# Patient Record
Sex: Male | Born: 1950 | Race: White | Hispanic: No | Marital: Married | Smoking: Never smoker
Health system: Southern US, Community
[De-identification: ages and names within clinical notes are randomized; demographics above are authoritative.]

## PROBLEM LIST (undated history)

## (undated) DIAGNOSIS — G589 Mononeuropathy, unspecified: Secondary | ICD-10-CM

## (undated) DIAGNOSIS — B019 Varicella without complication: Secondary | ICD-10-CM

## (undated) DIAGNOSIS — N189 Chronic kidney disease, unspecified: Secondary | ICD-10-CM

## (undated) DIAGNOSIS — M1990 Unspecified osteoarthritis, unspecified site: Secondary | ICD-10-CM

## (undated) DIAGNOSIS — T7840XA Allergy, unspecified, initial encounter: Secondary | ICD-10-CM

## (undated) DIAGNOSIS — Z9109 Other allergy status, other than to drugs and biological substances: Secondary | ICD-10-CM

## (undated) DIAGNOSIS — H269 Unspecified cataract: Secondary | ICD-10-CM

## (undated) DIAGNOSIS — C493 Malignant neoplasm of connective and soft tissue of thorax: Secondary | ICD-10-CM

## (undated) DIAGNOSIS — F419 Anxiety disorder, unspecified: Secondary | ICD-10-CM

## (undated) DIAGNOSIS — S149XXA Injury of unspecified nerves of neck, initial encounter: Secondary | ICD-10-CM

## (undated) DIAGNOSIS — N400 Enlarged prostate without lower urinary tract symptoms: Secondary | ICD-10-CM

## (undated) HISTORY — PX: APPENDECTOMY: SHX54

## (undated) HISTORY — PX: VASECTOMY: SHX75

## (undated) HISTORY — DX: Malignant neoplasm of connective and soft tissue of thorax: C49.3

## (undated) HISTORY — PX: COLONOSCOPY: SHX174

## (undated) HISTORY — PX: JOINT REPLACEMENT: SHX530

## (undated) HISTORY — PX: KNEE ARTHROSCOPY: SUR90

## (undated) HISTORY — DX: Allergy, unspecified, initial encounter: T78.40XA

## (undated) HISTORY — DX: Varicella without complication: B01.9

## (undated) HISTORY — PX: TONSILLECTOMY: SUR1361

---

## 1978-09-24 HISTORY — PX: CHOLECYSTECTOMY: SHX55

## 2003-09-25 HISTORY — PX: WRIST SURGERY: SHX841

## 2008-09-24 HISTORY — PX: HERNIA REPAIR: SHX51

## 2009-08-21 ENCOUNTER — Emergency Department (HOSPITAL_COMMUNITY): Admission: EM | Admit: 2009-08-21 | Discharge: 2009-08-21 | Payer: Self-pay | Admitting: Emergency Medicine

## 2009-09-13 ENCOUNTER — Encounter: Admission: RE | Admit: 2009-09-13 | Discharge: 2009-09-13 | Payer: Self-pay | Admitting: General Surgery

## 2009-12-29 IMAGING — CR DG CHEST 2V
2 series · 2 of 2 positions shown · non-contrast
Comparison: 08/21/2009

CLINICAL DATA: Preop.  Chest tightness and congestion.

[view not recorded (1 of 2)]
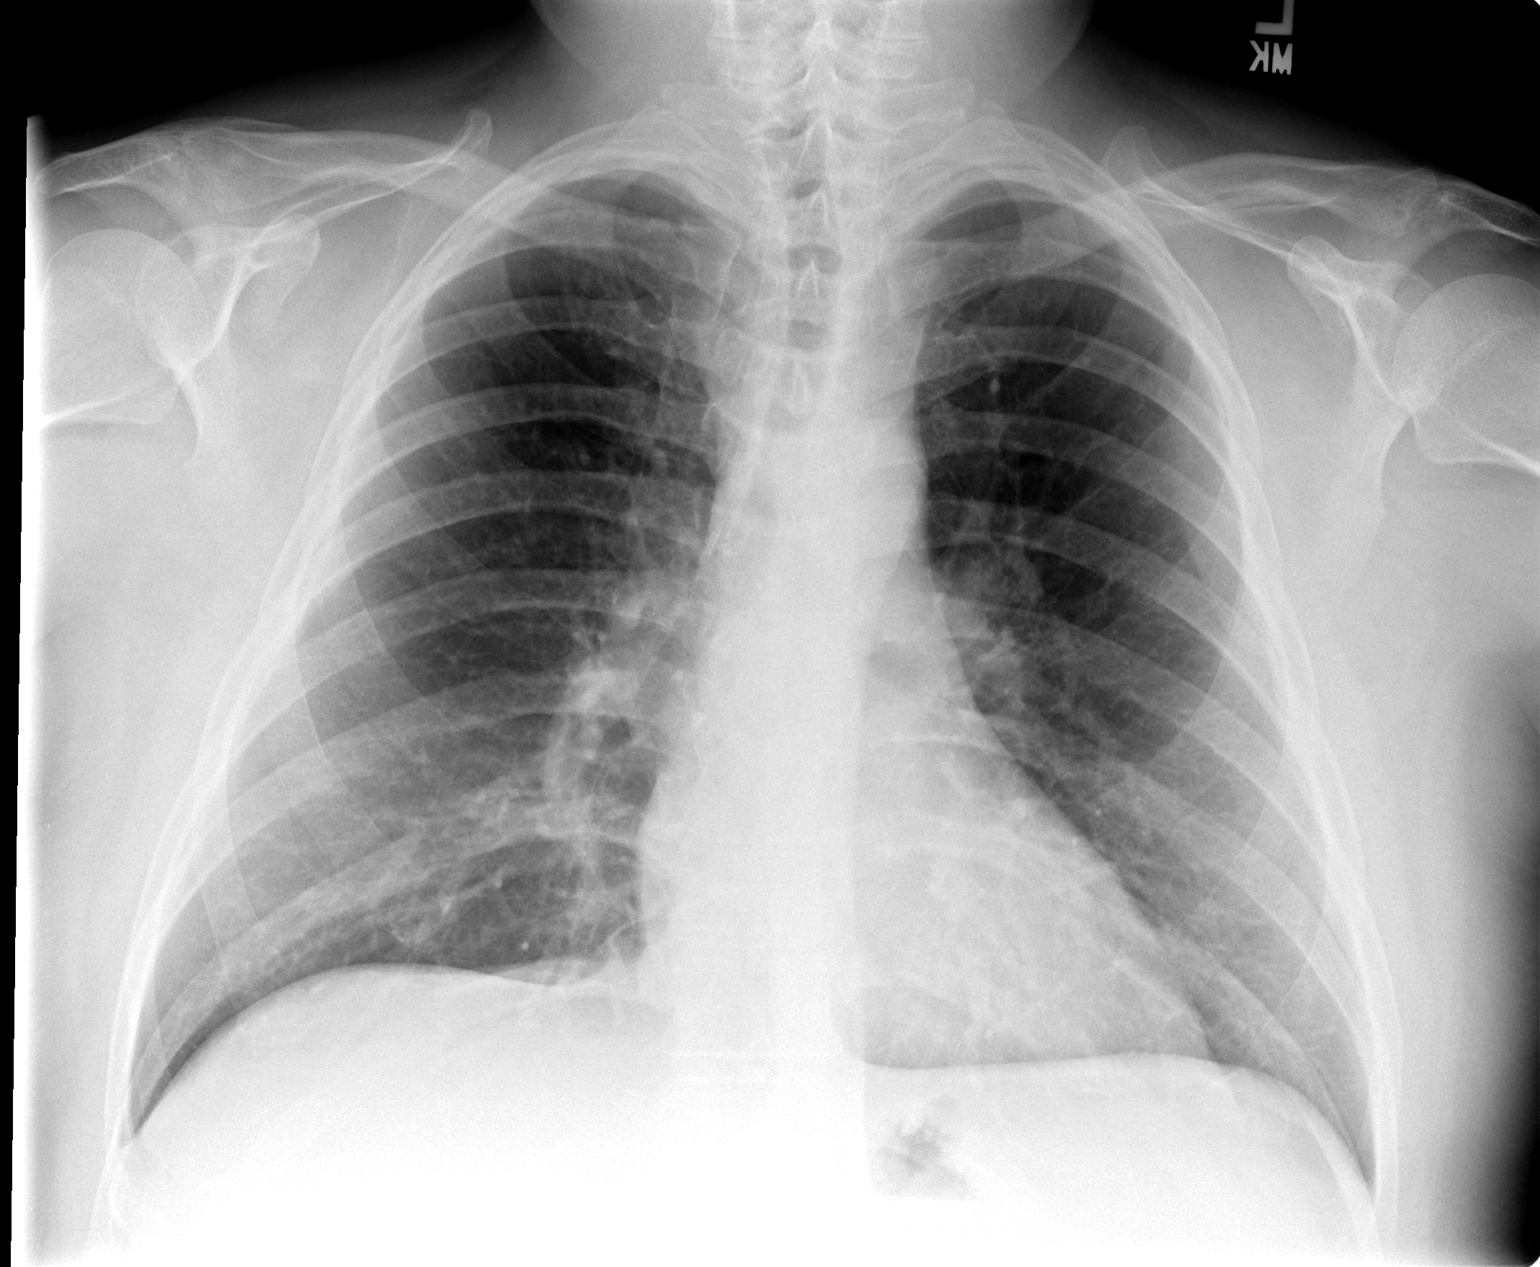

[view not recorded (2 of 2)]
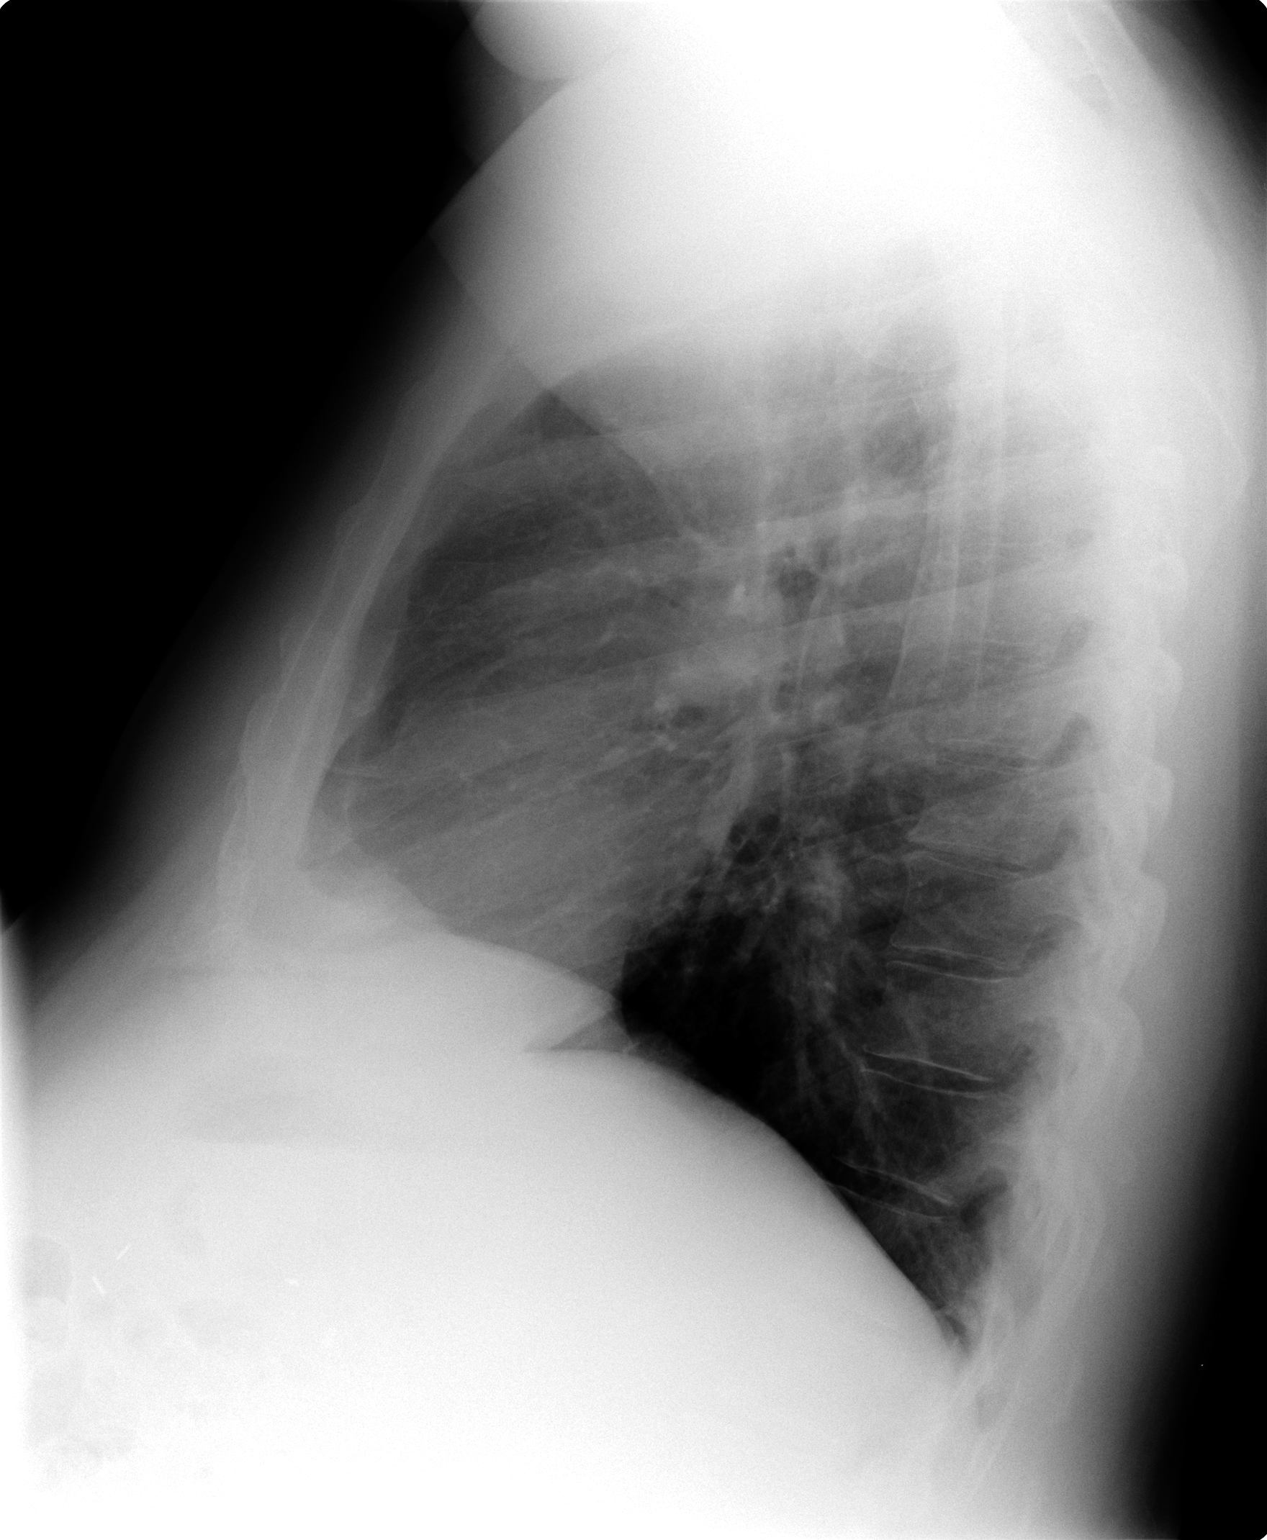

[2 of 2 positions shown; findings below may reference images not displayed]

FINDINGS: Trachea is midline.  Heart size normal.  Lungs are clear.
No pleural fluid.
IMPRESSION: No acute findings.

## 2011-08-09 ENCOUNTER — Encounter (HOSPITAL_COMMUNITY): Payer: Self-pay | Admitting: Pharmacy Technician

## 2011-08-10 ENCOUNTER — Encounter (HOSPITAL_COMMUNITY): Payer: Self-pay

## 2011-08-10 ENCOUNTER — Other Ambulatory Visit: Payer: Self-pay

## 2011-08-10 ENCOUNTER — Ambulatory Visit (HOSPITAL_COMMUNITY)
Admission: RE | Admit: 2011-08-10 | Discharge: 2011-08-10 | Disposition: A | Discharge status: Home / Self Care | Payer: BC Managed Care – PPO | Source: Ambulatory Visit | Attending: Orthopedic Surgery | Admitting: Orthopedic Surgery

## 2011-08-10 ENCOUNTER — Encounter (HOSPITAL_COMMUNITY)
Admission: RE | Admit: 2011-08-10 | Discharge: 2011-08-10 | Disposition: A | Discharge status: Home / Self Care | Payer: BC Managed Care – PPO | Source: Ambulatory Visit | Attending: Orthopedic Surgery | Admitting: Orthopedic Surgery

## 2011-08-10 DIAGNOSIS — Z01810 Encounter for preprocedural cardiovascular examination: Secondary | ICD-10-CM | POA: Insufficient documentation

## 2011-08-10 DIAGNOSIS — Z01812 Encounter for preprocedural laboratory examination: Secondary | ICD-10-CM | POA: Insufficient documentation

## 2011-08-10 DIAGNOSIS — Z01818 Encounter for other preprocedural examination: Secondary | ICD-10-CM | POA: Insufficient documentation

## 2011-08-10 DIAGNOSIS — E119 Type 2 diabetes mellitus without complications: Secondary | ICD-10-CM | POA: Insufficient documentation

## 2011-08-10 DIAGNOSIS — Q619 Cystic kidney disease, unspecified: Secondary | ICD-10-CM | POA: Insufficient documentation

## 2011-08-10 HISTORY — DX: Unspecified osteoarthritis, unspecified site: M19.90

## 2011-08-10 HISTORY — DX: Chronic kidney disease, unspecified: N18.9

## 2011-08-10 LAB — BASIC METABOLIC PANEL
CO2: 27 mEq/L (ref 19–32)
Calcium: 9.4 mg/dL (ref 8.4–10.5)
Creatinine, Ser: 1.16 mg/dL (ref 0.50–1.35)

## 2011-08-10 LAB — URINALYSIS, ROUTINE W REFLEX MICROSCOPIC
Bilirubin Urine: NEGATIVE
Glucose, UA: NEGATIVE mg/dL
Hgb urine dipstick: NEGATIVE
Specific Gravity, Urine: 1.007 (ref 1.005–1.030)
pH: 7 (ref 5.0–8.0)

## 2011-08-10 LAB — DIFFERENTIAL
Basophils Absolute: 0 10*3/uL (ref 0.0–0.1)
Basophils Relative: 0 % (ref 0–1)
Eosinophils Absolute: 0.2 10*3/uL (ref 0.0–0.7)
Eosinophils Relative: 3 % (ref 0–5)
Lymphocytes Relative: 24 % (ref 12–46)

## 2011-08-10 LAB — CBC
MCV: 81.8 fL (ref 78.0–100.0)
Platelets: 211 10*3/uL (ref 150–400)
RDW: 13.8 % (ref 11.5–15.5)
WBC: 7.9 10*3/uL (ref 4.0–10.5)

## 2011-08-10 LAB — ABO/RH: ABO/RH(D): O POS

## 2011-08-10 LAB — APTT: aPTT: 29 seconds (ref 24–37)

## 2011-08-10 LAB — SURGICAL PCR SCREEN: Staphylococcus aureus: NEGATIVE

## 2011-08-10 LAB — PROTIME-INR: Prothrombin Time: 13.1 seconds (ref 11.6–15.2)

## 2011-08-10 NOTE — Pre-Procedure Instructions (Signed)
20 Melvin Montoya  08/10/2011   Your procedure is scheduled on: 08-20-2011  Report to [facility] at 5:30 AM.  Call this number if you have problems the morning of surgery: [phone]   Remember:   Do not eat food:After Midnight.  Do not drink clear liquids: 4 Hours before arrival.  May have soda,tea,water,black coffee,apple juice and grape juice until 1:30 AM  Take these medicines the morning of surgery with A SIP OF WATER: Avodart, Hydrocodone,Nasonex,Flomax   Do not wear jewelry, make-up or nail polish.  Do not wear lotions, powders, or perfumes. You may wear deodorant.  Do not shave 48 hours prior to surgery.  Do not bring valuables to the hospital.  Contacts, dentures or bridgework may not be worn into surgery.  Leave suitcase in the car. After surgery it may be brought to your room.  For patients admitted to the hospital, checkout time is 11:00 AM the day of discharge.   Patients discharged the day of surgery will not be allowed to drive home.  Name and phone number of your driver:  Special Instructions: Incentive Spirometry - Practice and bring it with you on the day of surgery. and CHG Shower Use Special Wash: 1/2 bottle night before surgery and 1/2 bottle morning of surgery.   Please read over the following fact sheets that you were given: Blood Transfusion Information, MRSA Information and Surgical Site Infection Prevention    

## 2011-08-10 NOTE — Pre-Procedure Instructions (Signed)
20 Melvin Montoya  08/10/2011   Your procedure is scheduled on: 08-20-2011  Report to [facility] at 5:30 AM.  Call this number if you have problems the morning of surgery: [phone]   Remember:   Do not eat food:After Midnight.  Do not drink clear liquids: 4 Hours before arrival.  May have soda,tea,water,black coffee,apple juice and grape juice until 1:30 AM  Take these medicines the morning of surgery with A SIP OF WATER: Avodart, Hydrocodone,Nasonex,Flomax   Do not wear jewelry, make-up or nail polish.  Do not wear lotions, powders, or perfumes. You may wear deodorant.  Do not shave 48 hours prior to surgery.  Do not bring valuables to the hospital.  Contacts, dentures or bridgework may not be worn into surgery.  Leave suitcase in the car. After surgery it may be brought to your room.  For patients admitted to the hospital, checkout time is 11:00 AM the day of discharge.   Patients discharged the day of surgery will not be allowed to drive home.  Name and phone number of your driver:  Special Instructions: Incentive Spirometry - Practice and bring it with you on the day of surgery. and CHG Shower Use Special Wash: 1/2 bottle night before surgery and 1/2 bottle morning of surgery.   Please read over the following fact sheets that you were given: Blood Transfusion Information, MRSA Information and Surgical Site Infection Prevention    

## 2011-08-10 NOTE — Progress Notes (Signed)
SPOKE WITH CAROL AT [facility] OFFICE RE WHETHER PT NEEDS TO STOP ASPIRIN.  CAROL WILL CHECK WITH DR Turner Daniels AND CALL PATIENT AT HOME .

## 2011-08-10 NOTE — Pre-Procedure Instructions (Signed)
20 Melvin Montoya  08/10/2011   Your procedure is scheduled on: 08-20-2011  Report to [facility] at 5:30 AM.  Call this number if you have problems the morning of surgery: [phone]   Remember:   Do not eat food:After Midnight.  Do not drink clear liquids: 4 Hours before arrival.  May have soda,tea,water,black coffee,apple juice and grape juice until 1:30 AM  Take these medicines the morning of surgery with A SIP OF WATER: Avodart, Hydrocodone,Nasonex,Flomax   Do not wear jewelry, make-up or nail polish.  Do not wear lotions, powders, or perfumes. You may wear deodorant.  Do not shave 48 hours prior to surgery.  Do not bring valuables to the hospital.  Contacts, dentures or bridgework may not be worn into surgery.  Leave suitcase in the car. After surgery it may be brought to your room.  For patients admitted to the hospital, checkout time is 11:00 AM the day of discharge.   Patients discharged the day of surgery will not be allowed to drive home.  Name and phone number of your driver:  Special Instructions: Incentive Spirometry - Practice and bring it with you on the day of surgery. and CHG Shower Use Special Wash: 1/2 bottle night before surgery and 1/2 bottle morning of surgery.   Please read over the following fact sheets that you were given: Blood Transfusion Information, MRSA Information and Surgical Site Infection Prevention

## 2011-08-11 ENCOUNTER — Encounter (HOSPITAL_COMMUNITY): Payer: Self-pay | Admitting: *Deleted

## 2011-08-19 MED ORDER — CEFAZOLIN SODIUM 1-5 GM-% IV SOLN
1.0000 g | INTRAVENOUS | Status: DC
  Filled 2011-08-19: qty 50

## 2011-08-19 MED ORDER — DEXTROSE-NACL 5-0.45 % IV SOLN: INTRAVENOUS | Status: DC

## 2011-08-19 NOTE — H&P (Signed)
  CC: Severe L>R Knee Pain DJD  HISTORY OF PRESENT ILLNESS:  Mr. Gann returns after a 3-year absence for discussion of the predicament he has with both knees.  Right now the left hurts worse than the right.  When I saw him back in 2009 he had months osteoarthritis.  He had been treated with anti-inflammatory medicines, physical therapy, cortisone injections, and most recently Supartz injections in September 2009 that initially gave him good pain relief but his pain is now once again increasing.  It wakes him at night.  It interferes with doing daily chores and he has failed other conservative measures.  He is interested in having a knee replacement, left than right.  No fevers.  No chills.  No intervening trauma.  PMH\ROS:  He noticed the pain first in 2009, steadily getting worse.  He has had back pain in the past.  No allergies.  He is a diabetic on oral medications and diet.  He does not have an elevated cholesterol.  He wears contact lenses, has a history of a cough, blood in his stool.  Medications include Vytorin, Actos, Avodart, tamsulosin, Lisinopril.  He has never taken blood thinners.  Previous hospitalizations:  None.  Surgeries:  Gallbladder removal in 1981, wrist surgery in 2006, umbilical hernia 2011, and the arthroscopic knee surgery in 2009.  Family History:  Positive for diabetes and heart disease.    He does not smoke, does not drink.  He is married and is here with his wife.  He has been out of work now for about a week or so and is retired.  OBJECTIVE:  Both knees have 5 degree flexion contractures and 5 degree varus deformities.  Tender along the medial joint line, left greater than right knee.   Crepitus as you take it through range of motion. AP, lateral, [place] and sunrise views of both knees show end stage arthritis to the medial compartment, moderate arthritis to the patellofemoral compartment.  He is pretty much down to bone-on- bone with peripheral osteophytes.  He is  an otherwise well-nourished,  well-developed 60 year old gentleman seated on the exam table in no obvious distress but he walks with a bilateral Trendelenburg gait.  In the past he has attempted to use a cane but simply does not like it.  Normal pulses to the foot. Full range of motion of the hips and ankles.  IMPRESSION:  End stage arthritis, left greater than right knee, for many years having failed all conservative measures.  PLAN:  Options were discussed at length.  He would like to proceed with left total knee arthroplasty. Risks and benefits of surgery discussed.  Questions answered.  Models were brought into the room.

## 2011-08-20 ENCOUNTER — Encounter (HOSPITAL_COMMUNITY): Payer: Self-pay

## 2011-08-20 ENCOUNTER — Encounter (HOSPITAL_COMMUNITY): Payer: Self-pay | Admitting: *Deleted

## 2011-08-20 ENCOUNTER — Encounter (HOSPITAL_COMMUNITY): Payer: Self-pay | Admitting: Orthopedic Surgery

## 2011-08-20 ENCOUNTER — Inpatient Hospital Stay (HOSPITAL_COMMUNITY): Payer: BC Managed Care – PPO

## 2011-08-20 ENCOUNTER — Inpatient Hospital Stay (HOSPITAL_COMMUNITY)
Admission: RE | Admit: 2011-08-20 | Discharge: 2011-08-23 | DRG: 209 | Disposition: A | Discharge status: Home / Self Care | Payer: BC Managed Care – PPO | Source: Ambulatory Visit | Attending: Orthopedic Surgery | Admitting: Orthopedic Surgery

## 2011-08-20 ENCOUNTER — Encounter (HOSPITAL_COMMUNITY)
Admission: RE | Disposition: A | Discharge status: Home / Self Care | Payer: Self-pay | Source: Ambulatory Visit | Attending: Orthopedic Surgery

## 2011-08-20 DIAGNOSIS — Z23 Encounter for immunization: Secondary | ICD-10-CM

## 2011-08-20 DIAGNOSIS — M1712 Unilateral primary osteoarthritis, left knee: Secondary | ICD-10-CM

## 2011-08-20 DIAGNOSIS — M1710 Unilateral primary osteoarthritis, unspecified knee: Principal | ICD-10-CM | POA: Diagnosis present

## 2011-08-20 HISTORY — PX: TOTAL KNEE ARTHROPLASTY: SHX125

## 2011-08-20 LAB — GLUCOSE, CAPILLARY: Glucose-Capillary: 122 mg/dL — ABNORMAL HIGH (ref 70–99)

## 2011-08-20 LAB — HEMOGLOBIN A1C
Hgb A1c MFr Bld: 6.4 % — ABNORMAL HIGH (ref <5.7)
Mean Plasma Glucose: 137 mg/dL — ABNORMAL HIGH (ref <117)

## 2011-08-20 SURGERY — ARTHROPLASTY, KNEE, TOTAL
Anesthesia: Regional | Site: Knee | Laterality: Left | Wound class: Clean

## 2011-08-20 MED ORDER — CYCLOBENZAPRINE HCL 10 MG PO TABS
10.0000 mg | ORAL_TABLET | Freq: Three times a day (TID) | ORAL | Status: DC | PRN
  Administered 2011-08-20 – 2011-08-23 (×5): 10 mg via ORAL
  Filled 2011-08-20 (×6): qty 1

## 2011-08-20 MED ORDER — ONDANSETRON HCL 4 MG/2ML IJ SOLN: 4.0000 mg | Freq: Once | INTRAMUSCULAR | Status: DC | PRN

## 2011-08-20 MED ORDER — PNEUMOCOCCAL VAC POLYVALENT 25 MCG/0.5ML IJ INJ
0.5000 mL | INJECTION | INTRAMUSCULAR | Status: DC
  Filled 2011-08-20: qty 0.5

## 2011-08-20 MED ORDER — MORPHINE SULFATE 10 MG/ML IJ SOLN
INTRAMUSCULAR | Status: DC | PRN
  Administered 2011-08-20: 4 mg via INTRAVENOUS
  Administered 2011-08-20: 2 mg via INTRAVENOUS
  Administered 2011-08-20: 4 mg via INTRAVENOUS

## 2011-08-20 MED ORDER — CEFAZOLIN SODIUM-DEXTROSE 2-3 GM-% IV SOLR: 2.0000 g | INTRAVENOUS | Status: DC

## 2011-08-20 MED ORDER — PHENYLEPHRINE HCL 10 MG/ML IJ SOLN
INTRAMUSCULAR | Status: DC | PRN
  Administered 2011-08-20 (×4): 80 ug via INTRAVENOUS
  Administered 2011-08-20: 160 ug via INTRAVENOUS

## 2011-08-20 MED ORDER — MIDAZOLAM HCL 5 MG/5ML IJ SOLN
INTRAMUSCULAR | Status: DC | PRN
  Administered 2011-08-20: 2 mg via INTRAVENOUS

## 2011-08-20 MED ORDER — FENTANYL CITRATE 0.05 MG/ML IJ SOLN
INTRAMUSCULAR | Status: DC | PRN
  Administered 2011-08-20 (×3): 50 ug via INTRAVENOUS

## 2011-08-20 MED ORDER — CEFUROXIME SODIUM 1.5 G IJ SOLR
INTRAMUSCULAR | Status: DC | PRN
  Administered 2011-08-20: 1.5 g

## 2011-08-20 MED ORDER — HYDROMORPHONE HCL PF 1 MG/ML IJ SOLN
0.5000 mg | INTRAMUSCULAR | Status: DC | PRN
  Administered 2011-08-20 – 2011-08-21 (×5): 1 mg via INTRAVENOUS
  Filled 2011-08-20 (×5): qty 1

## 2011-08-20 MED ORDER — POLYETHYLENE GLYCOL 3350 17 G PO PACK
17.0000 g | PACK | Freq: Every day | ORAL | Status: DC | PRN
  Filled 2011-08-20: qty 1

## 2011-08-20 MED ORDER — LORATADINE 10 MG PO TABS
10.0000 mg | ORAL_TABLET | Freq: Every day | ORAL | Status: DC
  Filled 2011-08-20: qty 1

## 2011-08-20 MED ORDER — CHLORHEXIDINE GLUCONATE 4 % EX LIQD: 60.0000 mL | Freq: Once | CUTANEOUS | Status: DC

## 2011-08-20 MED ORDER — BISACODYL 5 MG PO TBEC: 10.0000 mg | DELAYED_RELEASE_TABLET | Freq: Every day | ORAL | Status: DC | PRN

## 2011-08-20 MED ORDER — ZINC GLUCONATE 13.3 MG MT LOZG: 1.0000 | LOZENGE | Freq: Every day | OROMUCOSAL | Status: DC | PRN

## 2011-08-20 MED ORDER — CEFAZOLIN SODIUM-DEXTROSE 2-3 GM-% IV SOLR
INTRAVENOUS | Status: AC
  Administered 2011-08-20: 2 g via INTRAVENOUS
  Filled 2011-08-20: qty 50

## 2011-08-20 MED ORDER — SIMVASTATIN 20 MG PO TABS
20.0000 mg | ORAL_TABLET | Freq: Every day | ORAL | Status: DC
  Filled 2011-08-20: qty 1

## 2011-08-20 MED ORDER — ONDANSETRON HCL 4 MG/2ML IJ SOLN
INTRAMUSCULAR | Status: DC | PRN
  Administered 2011-08-20: 4 mg via INTRAVENOUS

## 2011-08-20 MED ORDER — ALUM & MAG HYDROXIDE-SIMETH 200-200-20 MG/5ML PO SUSP: 30.0000 mL | ORAL | Status: DC | PRN

## 2011-08-20 MED ORDER — THERA M PLUS PO TABS
1.0000 | ORAL_TABLET | Freq: Every day | ORAL | Status: DC
  Administered 2011-08-20 – 2011-08-22 (×3): 1 via ORAL
  Filled 2011-08-20 (×4): qty 1

## 2011-08-20 MED ORDER — MAGNESIUM HYDROXIDE 400 MG/5ML PO SUSP: 30.0000 mL | Freq: Two times a day (BID) | ORAL | Status: DC | PRN

## 2011-08-20 MED ORDER — HYDROCODONE-ACETAMINOPHEN 5-325 MG PO TABS
1.0000 | ORAL_TABLET | ORAL | Status: DC | PRN
  Administered 2011-08-22 – 2011-08-23 (×2): 2 via ORAL
  Filled 2011-08-20 (×2): qty 2

## 2011-08-20 MED ORDER — GLYCOPYRROLATE 0.2 MG/ML IJ SOLN
INTRAMUSCULAR | Status: DC | PRN
  Administered 2011-08-20: .2 mg via INTRAVENOUS
  Administered 2011-08-20: .6 mg via INTRAVENOUS

## 2011-08-20 MED ORDER — KCL IN DEXTROSE-NACL 20-5-0.45 MEQ/L-%-% IV SOLN
INTRAVENOUS | Status: DC
  Administered 2011-08-20 – 2011-08-21 (×4): via INTRAVENOUS
  Filled 2011-08-20 (×10): qty 1000

## 2011-08-20 MED ORDER — BUPIVACAINE-EPINEPHRINE PF 0.5-1:200000 % IJ SOLN
INTRAMUSCULAR | Status: DC | PRN
  Administered 2011-08-20: 30 mL

## 2011-08-20 MED ORDER — VECURONIUM BROMIDE 10 MG IV SOLR
INTRAVENOUS | Status: DC | PRN
  Administered 2011-08-20: 8 mg via INTRAVENOUS

## 2011-08-20 MED ORDER — LACTATED RINGERS IV SOLN
INTRAVENOUS | Status: DC | PRN
  Administered 2011-08-20 (×2): via INTRAVENOUS

## 2011-08-20 MED ORDER — WARFARIN SODIUM 7.5 MG PO TABS
7.5000 mg | ORAL_TABLET | Freq: Once | ORAL | Status: AC
  Administered 2011-08-20: 7.5 mg via ORAL
  Filled 2011-08-20: qty 1

## 2011-08-20 MED ORDER — FLEET ENEMA 7-19 GM/118ML RE ENEM: 1.0000 | ENEMA | Freq: Every day | RECTAL | Status: DC | PRN

## 2011-08-20 MED ORDER — SIMVASTATIN 20 MG PO TABS
20.0000 mg | ORAL_TABLET | Freq: Every day | ORAL | Status: DC
  Administered 2011-08-21 – 2011-08-22 (×2): 20 mg via ORAL
  Filled 2011-08-20 (×3): qty 1

## 2011-08-20 MED ORDER — PSEUDOEPHEDRINE HCL ER 120 MG PO TB12
240.0000 mg | ORAL_TABLET | Freq: Every day | ORAL | Status: DC
  Filled 2011-08-20: qty 2

## 2011-08-20 MED ORDER — EPHEDRINE SULFATE 50 MG/ML IJ SOLN
INTRAMUSCULAR | Status: DC | PRN
  Administered 2011-08-20: 5 mg via INTRAVENOUS
  Administered 2011-08-20: 10 mg via INTRAVENOUS
  Administered 2011-08-20: 5 mg via INTRAVENOUS

## 2011-08-20 MED ORDER — EZETIMIBE-SIMVASTATIN 10-20 MG PO TABS: 1.0000 | ORAL_TABLET | ORAL | Status: DC

## 2011-08-20 MED ORDER — SODIUM CHLORIDE 0.9 % IR SOLN
Status: DC | PRN
  Administered 2011-08-20: 1000 mL

## 2011-08-20 MED ORDER — INSULIN ASPART 100 UNIT/ML ~~LOC~~ SOLN
0.0000 [IU] | Freq: Three times a day (TID) | SUBCUTANEOUS | Status: DC
  Administered 2011-08-20: 2 [IU] via SUBCUTANEOUS
  Administered 2011-08-21: 3 [IU] via SUBCUTANEOUS
  Administered 2011-08-21 – 2011-08-23 (×5): 2 [IU] via SUBCUTANEOUS
  Filled 2011-08-20: qty 3

## 2011-08-20 MED ORDER — EZETIMIBE 10 MG PO TABS
10.0000 mg | ORAL_TABLET | Freq: Every day | ORAL | Status: DC
  Administered 2011-08-21 – 2011-08-22 (×2): 10 mg via ORAL
  Filled 2011-08-20 (×4): qty 1

## 2011-08-20 MED ORDER — NEOSTIGMINE METHYLSULFATE 1 MG/ML IJ SOLN
INTRAMUSCULAR | Status: DC | PRN
  Administered 2011-08-20: 4 mg via INTRAVENOUS

## 2011-08-20 MED ORDER — PHENOL 1.4 % MT LIQD
1.0000 | OROMUCOSAL | Status: DC | PRN
  Filled 2011-08-20: qty 177

## 2011-08-20 MED ORDER — DIPHENHYDRAMINE HCL 12.5 MG/5ML PO ELIX
12.5000 mg | ORAL_SOLUTION | ORAL | Status: DC | PRN
  Filled 2011-08-20: qty 10

## 2011-08-20 MED ORDER — LISINOPRIL 20 MG PO TABS
20.0000 mg | ORAL_TABLET | Freq: Every day | ORAL | Status: DC
  Administered 2011-08-21 – 2011-08-22 (×2): 20 mg via ORAL
  Filled 2011-08-20 (×4): qty 1

## 2011-08-20 MED ORDER — METOCLOPRAMIDE HCL 10 MG PO TABS: 5.0000 mg | ORAL_TABLET | Freq: Three times a day (TID) | ORAL | Status: DC | PRN

## 2011-08-20 MED ORDER — HYDROMORPHONE HCL PF 1 MG/ML IJ SOLN
0.2500 mg | INTRAMUSCULAR | Status: DC | PRN
  Administered 2011-08-20 (×2): 0.5 mg via INTRAVENOUS

## 2011-08-20 MED ORDER — ACETAMINOPHEN 650 MG RE SUPP: 650.0000 mg | Freq: Four times a day (QID) | RECTAL | Status: DC | PRN

## 2011-08-20 MED ORDER — DOCUSATE SODIUM 100 MG PO CAPS
100.0000 mg | ORAL_CAPSULE | Freq: Two times a day (BID) | ORAL | Status: DC
  Administered 2011-08-20 – 2011-08-22 (×5): 100 mg via ORAL
  Filled 2011-08-20 (×7): qty 1

## 2011-08-20 MED ORDER — ENOXAPARIN SODIUM 30 MG/0.3ML ~~LOC~~ SOLN
30.0000 mg | Freq: Two times a day (BID) | SUBCUTANEOUS | Status: DC
  Administered 2011-08-20 – 2011-08-22 (×5): 30 mg via SUBCUTANEOUS
  Filled 2011-08-20 (×7): qty 0.3

## 2011-08-20 MED ORDER — WARFARIN VIDEO
Freq: Once | Status: DC
  Filled 2011-08-20: qty 1

## 2011-08-20 MED ORDER — ONDANSETRON HCL 4 MG/2ML IJ SOLN: 4.0000 mg | Freq: Four times a day (QID) | INTRAMUSCULAR | Status: DC | PRN

## 2011-08-20 MED ORDER — METOCLOPRAMIDE HCL 5 MG/ML IJ SOLN
5.0000 mg | Freq: Three times a day (TID) | INTRAMUSCULAR | Status: DC | PRN
  Filled 2011-08-20: qty 2

## 2011-08-20 MED ORDER — DUTASTERIDE 0.5 MG PO CAPS
0.5000 mg | ORAL_CAPSULE | Freq: Every day | ORAL | Status: DC
  Administered 2011-08-20 – 2011-08-22 (×3): 0.5 mg via ORAL
  Filled 2011-08-20 (×4): qty 1

## 2011-08-20 MED ORDER — ACETAMINOPHEN 325 MG PO TABS: 650.0000 mg | ORAL_TABLET | Freq: Four times a day (QID) | ORAL | Status: DC | PRN

## 2011-08-20 MED ORDER — OXYCODONE-ACETAMINOPHEN 5-325 MG PO TABS
1.0000 | ORAL_TABLET | ORAL | Status: DC | PRN
  Administered 2011-08-20 – 2011-08-22 (×10): 2 via ORAL
  Filled 2011-08-20 (×11): qty 2

## 2011-08-20 MED ORDER — BISACODYL 10 MG RE SUPP: 10.0000 mg | Freq: Every day | RECTAL | Status: DC | PRN

## 2011-08-20 MED ORDER — ONDANSETRON HCL 4 MG PO TABS: 4.0000 mg | ORAL_TABLET | Freq: Four times a day (QID) | ORAL | Status: DC | PRN

## 2011-08-20 MED ORDER — PATIENT'S GUIDE TO USING COUMADIN BOOK
Freq: Once | Status: AC
  Administered 2011-08-20: 13:00:00
  Filled 2011-08-20: qty 1

## 2011-08-20 MED ORDER — DIPHENHYDRAMINE-APAP (SLEEP) 25-500 MG PO TABS: 1.0000 | ORAL_TABLET | Freq: Every evening | ORAL | Status: DC | PRN

## 2011-08-20 MED ORDER — ZOLPIDEM TARTRATE 5 MG PO TABS: 5.0000 mg | ORAL_TABLET | Freq: Every evening | ORAL | Status: DC | PRN

## 2011-08-20 MED ORDER — PROPOFOL 10 MG/ML IV EMUL
INTRAVENOUS | Status: DC | PRN
  Administered 2011-08-20: 150 mg via INTRAVENOUS

## 2011-08-20 MED ORDER — MENTHOL 3 MG MT LOZG: 1.0000 | LOZENGE | OROMUCOSAL | Status: DC | PRN

## 2011-08-20 MED ORDER — TAMSULOSIN HCL 0.4 MG PO CAPS
0.4000 mg | ORAL_CAPSULE | Freq: Every day | ORAL | Status: DC
  Administered 2011-08-20 – 2011-08-22 (×3): 0.4 mg via ORAL
  Filled 2011-08-20 (×4): qty 1

## 2011-08-20 MED ORDER — FLUTICASONE PROPIONATE 50 MCG/ACT NA SUSP
1.0000 | Freq: Every day | NASAL | Status: DC
  Filled 2011-08-20 (×2): qty 16

## 2011-08-20 MED ORDER — LORATADINE-PSEUDOEPHEDRINE ER 5-120 MG PO TB12: 2.0000 | ORAL_TABLET | ORAL | Status: DC

## 2011-08-20 MED ORDER — PIOGLITAZONE HCL 30 MG PO TABS
30.0000 mg | ORAL_TABLET | Freq: Every day | ORAL | Status: DC
  Administered 2011-08-20 – 2011-08-22 (×3): 30 mg via ORAL
  Filled 2011-08-20 (×4): qty 1

## 2011-08-20 MED ORDER — MEPERIDINE HCL 25 MG/ML IJ SOLN: 6.2500 mg | INTRAMUSCULAR | Status: DC | PRN

## 2011-08-20 SURGICAL SUPPLY — 59 items
BANDAGE ELASTIC 6 VELCRO ST LF (GAUZE/BANDAGES/DRESSINGS) ×2 IMPLANT
BANDAGE ESMARK 6X9 LF (GAUZE/BANDAGES/DRESSINGS) ×1 IMPLANT
BLADE SAG 18X100X1.27 (BLADE) ×2 IMPLANT
BLADE SAW SGTL 13X75X1.27 (BLADE) ×2 IMPLANT
BLADE SURG ROTATE 9660 (MISCELLANEOUS) ×2 IMPLANT
BNDG ELASTIC 6X10 VLCR STRL LF (GAUZE/BANDAGES/DRESSINGS) ×2 IMPLANT
BNDG ESMARK 6X9 LF (GAUZE/BANDAGES/DRESSINGS) ×2
BOWL SMART MIX CTS (DISPOSABLE) ×2 IMPLANT
CEMENT HV SMART SET (Cement) ×4 IMPLANT
CLOTH BEACON ORANGE TIMEOUT ST (SAFETY) ×2 IMPLANT
COVER BACK TABLE 24X17X13 BIG (DRAPES) IMPLANT
COVER SURGICAL LIGHT HANDLE (MISCELLANEOUS) ×2 IMPLANT
CUFF TOURNIQUET SINGLE 34IN LL (TOURNIQUET CUFF) ×2 IMPLANT
CUFF TOURNIQUET SINGLE 44IN (TOURNIQUET CUFF) IMPLANT
DRAPE EXTREMITY T 121X128X90 (DRAPE) ×2 IMPLANT
DRAPE U-SHAPE 47X51 STRL (DRAPES) ×2 IMPLANT
DURAPREP 26ML APPLICATOR (WOUND CARE) ×2 IMPLANT
ELECT REM PT RETURN 9FT ADLT (ELECTROSURGICAL) ×2
ELECTRODE REM PT RTRN 9FT ADLT (ELECTROSURGICAL) ×1 IMPLANT
EVACUATOR 1/8 PVC DRAIN (DRAIN) ×2 IMPLANT
GAUZE SPONGE 4X4 12PLY STRL LF (GAUZE/BANDAGES/DRESSINGS) ×2 IMPLANT
GAUZE XEROFORM 1X8 LF (GAUZE/BANDAGES/DRESSINGS) ×2 IMPLANT
GLOVE BIO SURGEON STRL SZ7 (GLOVE) ×4 IMPLANT
GLOVE BIO SURGEON STRL SZ7.5 (GLOVE) ×2 IMPLANT
GLOVE BIOGEL PI IND STRL 7.0 (GLOVE) ×1 IMPLANT
GLOVE BIOGEL PI IND STRL 8 (GLOVE) ×1 IMPLANT
GLOVE BIOGEL PI IND STRL 8.5 (GLOVE) ×1 IMPLANT
GLOVE BIOGEL PI INDICATOR 7.0 (GLOVE) ×1
GLOVE BIOGEL PI INDICATOR 8 (GLOVE) ×1
GLOVE BIOGEL PI INDICATOR 8.5 (GLOVE) ×1
GLOVE ECLIPSE 8.5 STRL (GLOVE) ×6 IMPLANT
GLOVE SURG ORTHO 7.0 STRL STRW (GLOVE) ×2 IMPLANT
GOWN PREVENTION PLUS XLARGE (GOWN DISPOSABLE) ×2 IMPLANT
GOWN STRL NON-REIN LRG LVL3 (GOWN DISPOSABLE) ×4 IMPLANT
HANDPIECE INTERPULSE COAX TIP (DISPOSABLE) ×1
HOOD PEEL AWAY FACE SHEILD DIS (HOOD) ×6 IMPLANT
KIT BASIN OR (CUSTOM PROCEDURE TRAY) ×2 IMPLANT
KIT ROOM TURNOVER OR (KITS) ×2 IMPLANT
MANIFOLD NEPTUNE II (INSTRUMENTS) ×2 IMPLANT
NS IRRIG 1000ML POUR BTL (IV SOLUTION) ×2 IMPLANT
PACK TOTAL JOINT (CUSTOM PROCEDURE TRAY) ×2 IMPLANT
PAD ARMBOARD 7.5X6 YLW CONV (MISCELLANEOUS) ×2 IMPLANT
PADDING CAST COTTON 6X4 STRL (CAST SUPPLIES) ×2 IMPLANT
PADDING WEBRIL 6 STERILE (GAUZE/BANDAGES/DRESSINGS) ×2 IMPLANT
SET HNDPC FAN SPRY TIP SCT (DISPOSABLE) ×1 IMPLANT
SPONGE GAUZE 4X4 12PLY (GAUZE/BANDAGES/DRESSINGS) ×4 IMPLANT
STAPLER VISISTAT 35W (STAPLE) ×2 IMPLANT
SUCTION FRAZIER TIP 10 FR DISP (SUCTIONS) ×2 IMPLANT
SURGIFLO TRUKIT (HEMOSTASIS) IMPLANT
SUT VIC AB 0 CTX 36 (SUTURE) ×1
SUT VIC AB 0 CTX36XBRD ANTBCTR (SUTURE) ×1 IMPLANT
SUT VIC AB 1 CTX 36 (SUTURE) ×1
SUT VIC AB 1 CTX36XBRD ANBCTR (SUTURE) ×1 IMPLANT
SUT VIC AB 2-0 CT1 27 (SUTURE) ×1
SUT VIC AB 2-0 CT1 TAPERPNT 27 (SUTURE) ×1 IMPLANT
TOWEL OR 17X24 6PK STRL BLUE (TOWEL DISPOSABLE) ×2 IMPLANT
TOWEL OR 17X26 10 PK STRL BLUE (TOWEL DISPOSABLE) ×2 IMPLANT
TRAY FOLEY CATH 14FR (SET/KITS/TRAYS/PACK) ×2 IMPLANT
WATER STERILE IRR 1000ML POUR (IV SOLUTION) ×6 IMPLANT

## 2011-08-20 NOTE — Op Note (Signed)
PATIENT ID:      Melvin Montoya  MRN:     [id number] DOB/AGE:    Nov 24, 1950 / 60 y.o.       OPERATIVE REPORT    DATE OF PROCEDURE:  08/20/2011       PREOPERATIVE DIAGNOSIS:   osteoarthritis left knee      There is no height or weight on file to calculate BMI.                                                        POSTOPERATIVE DIAGNOSIS:   osteoarthritis left knee                                                                      PROCEDURE:  Procedure(s): TOTAL KNEE ARTHROPLASTY Using Depuy Sigma RP implants #5 Femur, #4Tibia, 10mm sigma RP bearing, 38 Patella     SURGEON: Melvin Montoya    ASSISTANT:   Melvin Harris PA-C   (Present and scrubbed throughout the case, critical for assistance with exposure, retraction, instrumentation, and closure.)         ANESTHESIA: General and Regional   DRAINS: (2) Hemovact drain(s) in the L Knee with  Suction Open and Urinary Catheter (Foley)   TOURNIQUET TIME:    COMPLICATIONS:  None     SPECIMENS: None   INDICATIONS FOR PROCEDURE: The patient has  osteoarthritis left knee, varus deformities, XR shows bone on bone arthritis. Patient has failed all conservative measures including anti-inflammatory medicines, narcotics, attempts at  exercise and weight loss, cortisone injections and viscosupplementation.  Risks and benefits of surgery have been discussed, questions answered.   DESCRIPTION OF PROCEDURE: The patient identified by armband, received  right femoral nerve block and IV antibiotics, in the holding area at [facility]. Patient taken to the operating room, appropriate anesthetic  monitors were attached General endotracheal anesthesia induced with  the patient in supine position, Foley catheter was inserted. Tourniquet  applied high to the operative thigh. Lateral post and foot positioner  applied to the table, the lower extremity was then prepped and draped  in usual sterile fashion from the ankle to the tourniquet.  Time-out procedure was performed. The limb was wrapped with an Esmarch bandage and the tourniquet inflated to 350 mmHg. We began the operation by making the anterior midline incision starting at  handbreadth above the patella going over the patella 1 cm medial to and  4 cm distal to the tibial tubercle. Small bleeders in the skin and the  subcutaneous tissue identified and cauterized. Transverse retinaculum was incised and reflected medially and a medial parapatellar arthrotomy was accomplished. the patella was everted and theprepatellar fat pad resected. The superficial medial collateral  ligament was then elevated from anterior to posterior along the proximal  flare of the tibia and anterior half of the menisci resected. The knee was hyperflexed exposing bone on bone arthritis. Peripheral and notch osteophytes as well as the cruciate ligaments were then resected. We continued to  work our way around posteriorly along the proximal tibia, and externally  rotated the tibia subluxing it out from underneath the femur. A McHale  retractor was placed through the notch and a lateral Hohmann retractor  placed, and we then drilled through the proximal tibia in line with the  axis of the tibia followed by an intramedullary guide rod and 2-degree  posterior slope cutting guide. The tibial cutting guide was pinned into place  allowing resection of 4 mm of bone medially and about 8 mm of bone  laterally because of her varus deformity. Satisfied with the tibial resection, we then  entered the distal femur 2 mm anterior to the PCL origin with the  intramedullary guide rod and applied the distal femoral cutting guide  set at 11mm, with 5 degrees of valgus. This was pinned along the  epicondylar axis. At this point, the distal femoral cut was accomplished without difficulty. We then sized for a #5 femoral component and pinned the guide in 3 degrees of external rotation.The chamfer cutting guide was pinned into  place. The anterior, posterior, and chamfer cuts were accomplished without difficulty followed by  the Sigma RP box cutting guide and the box cut. We also removed posterior osteophytes from the posterior femoral condyles. At this  time, the knee was brought into full extension. We checked our  extension and flexion gaps and found them symmetric at 10mm.  The patella thickness measured at 25 mm. We felt a 38 patella would  fit. We set the cutting guide at 15 and removed the posterior 9.5-10 mm  of the patella sized for 38 button and drilled the lollipop. The knee  was then once again hyperflexed exposing the proximal tibia. We sized for a #4 tibial base plate, applied the smokestack and the conical reamer followed by the the Delta fin keel punch. We then hammered into place the Sigma RP trial femoral component, inserted a 10-mm trial bearing, trial patellar button, and took the knee through range of motion from 0-130 degrees. No thumb pressure was required for patellar  tracking. At this point, all trial components were removed, a double batch of DePuy HV cement with 1500 mg of Zinacef was mixed and applied to all bony metallic mating surfaces except for the posterior condyles of the femur itself. In order, we  hammered into place the tibial tray and removed excess cement, the femoral component and removed excess cement, a 10-mm Sigma RP bearing  was inserted, and the knee brought to full extension with compression.  The patellar button was clamped into place, and excess cement  removed. While the cement cured the wound was irrigated out with normal saline solution pulse lavage, and medium Hemovac drains were placed from an anterolateral  approach. Ligament stability and patellar tracking were checked and found to be excellent. The parapatellar arthrotomy was closed with  running #1 Vicryl suture. The subcutaneous tissue with 0 and 2-0 undyed  Vicryl suture, and the skin with skin staples. A dressing  of Xeroform,  4 x 4, dressing sponges, Webril, and Ace wrap applied. The patient  awakened, extubated, and taken to recovery room without difficulty.   Melvin Montoya 08/20/2011, 9:29 AM

## 2011-08-20 NOTE — Anesthesia Preprocedure Evaluation (Addendum)
Anesthesia Evaluation  Patient identified by MRN, date of birth, ID band Patient awake    Reviewed: Allergy & Precautions, H&P , NPO status , Patient's Chart, lab work & pertinent test results  Airway Mallampati: I TM Distance: >3 FB Neck ROM: Full    Dental  (+) Teeth Intact, Dental Advidsory Given and Chipped Chipped upper front:   Pulmonary    Pulmonary exam normal       Cardiovascular     Neuro/Psych    GI/Hepatic   Endo/Other  Diabetes mellitus-, Well Controlled, Type 2, Oral Hypoglycemic Agents  Renal/GU      Musculoskeletal   Abdominal   Peds  Hematology   Anesthesia Other Findings   Reproductive/Obstetrics                          Anesthesia Physical Anesthesia Plan  ASA: III  Anesthesia Plan: General   Post-op Pain Management:    Induction: Intravenous  Airway Management Planned: Oral ETT  Additional Equipment:   Intra-op Plan:   Post-operative Plan: Extubation in OR  Informed Consent: I have reviewed the patients History and Physical, chart, labs and discussed the procedure including the risks, benefits and alternatives for the proposed anesthesia with the patient or authorized representative who has indicated his/her understanding and acceptance.   Dental advisory given  Plan Discussed with: CRNA and Surgeon  Anesthesia Plan Comments:        Anesthesia Quick Evaluation

## 2011-08-20 NOTE — Brief Op Note (Signed)
08/20/2011  9:25 AM  PATIENT:  Melvin Montoya  60 y.o. male  PRE-OPERATIVE DIAGNOSIS:  osteoarthritis left knee  POST-OPERATIVE DIAGNOSIS:  osteoarthritis left knee  PROCEDURE:  Procedure(s): TOTAL KNEE ARTHROPLASTY  SURGEON:  Surgeon(s): [name]  PHYSICIAN ASSISTANT: Mauricia Area  ANESTHESIA:   regional and general  EBL:  Total I/O In: 1800 [I.V.:1800] Out: 310 [Urine:300; Blood:10]  BLOOD ADMINISTERED:none  DRAINS: (2) Hemovact drain(s) in the L Knee with  Suction Open and Urinary Catheter (Foley)   LOCAL MEDICATIONS USED:  NONE  SPECIMEN:  No Specimen  DISPOSITION OF SPECIMEN:  N/A  COUNTS:  YES  TOURNIQUET:   DICTATION: .Note written in EPIC  PLAN OF CARE: Admit to inpatient   PATIENT DISPOSITION:  PACU - hemodynamically stable.   Delay start of Pharmacological VTE agent (>24hrs) due to surgical blood loss or risk of bleeding:  {YES/NO/NOT APPLICABLE:20182

## 2011-08-20 NOTE — Progress Notes (Signed)
ANTICOAGULATION CONSULT NOTE - Initial Consult  Pharmacy Consult for Coumadin Indication: VTE prophylaxis  No Known Allergies   Medical History: Past Medical History  Diagnosis Date  . Arthritis   . Chronic kidney disease     PT ON LISINOPRIL WATCHING FUNCT  . Diabetes mellitus     Assessment: 33 YOM s/p L total knee arthroplasty to start coumadin for VTE prophylaxis. Pt. Also on lovenox 30mg  sq q12hrs first dose 2200 tonight. Pt. Baseline INR 0.97 per PTA lab.   Goal of Therapy:  INR 2-3   Plan:  1) coumadin 7.5mg  po x1 2) daily PT/INR with am labs   Riki Rusk 08/20/2011,12:53 PM

## 2011-08-20 NOTE — Transfer of Care (Signed)
Immediate Anesthesia Transfer of Care Note  Patient: Melvin Montoya  Procedure(s) Performed:  TOTAL KNEE ARTHROPLASTY  Patient Location: PACU  Anesthesia Type: General  Level of Consciousness: awake, alert  and oriented  Airway & Oxygen Therapy: Patient Spontanous Breathing  Post-op Assessment: Report given to PACU RN, Post -op Vital signs reviewed and unstable, Anesthesiologist notified and Patient moving all extremities X 4  Post vital signs: Reviewed and stable  Complications: No apparent anesthesia complications

## 2011-08-20 NOTE — Anesthesia Postprocedure Evaluation (Signed)
  Anesthesia Post-op Note  Patient: Melvin Montoya  Procedure(s) Performed:  TOTAL KNEE ARTHROPLASTY  Patient Location: PACU  Anesthesia Type: general with Femoral nerve block placed pre op. Level of Consciousness:awake alert  Airway and Oxygen Therapy: Patient connected to nasal cannula oxygen  Post-op Pain: moderate  Post-op Assessment: Post-op Vital signs reviewed, Patient's Cardiovascular Status Stable, Respiratory Function Stable, Patent Airway, No signs of Nausea or vomiting and Pain level controlled  Post-op Vital Signs: Reviewed and stable  Complications: No apparent anesthesia complications

## 2011-08-20 NOTE — Anesthesia Procedure Notes (Addendum)
Procedure Name: Intubation Date/Time: 08/20/2011 7:40 AM Performed by: Carmela Rima Pre-anesthesia Checklist: Emergency Drugs available, Patient identified, Timeout performed, Suction available and Patient being monitored Patient Re-evaluated:Patient Re-evaluated prior to inductionOxygen Delivery Method: Circle System Utilized Preoxygenation: Pre-oxygenation with 100% oxygen Intubation Type: IV induction Ventilation: Mask ventilation without difficulty Laryngoscope Size: Mac and 3 Grade View: Grade I Tube type: Oral Number of attempts: 1 Placement Confirmation: ETT inserted through vocal cords under direct vision,  breath sounds checked- equal and bilateral,  positive ETCO2 and CO2 detector Secured at: 23 cm Tube secured with: Tape Dental Injury: Teeth and Oropharynx as per pre-operative assessment    Anesthesia Regional Block:  Femoral nerve block  Pre-Anesthetic Checklist: ,, timeout performed, Correct Patient, Correct Site, Correct Laterality, Correct Procedure, Correct Position, site marked, Risks and benefits discussed,  Surgical consent,  Pre-op evaluation,  At surgeon's request and post-op pain management  Laterality: Left and Lower  Prep: chloraprep       Needles:  Injection technique: Single-shot  Needle Type: Echogenic Needle     Needle Length: 9cm  Needle Gauge: 22 and 22 G    Additional Needles:  Procedures: ultrasound guided Femoral nerve block Narrative:  Start time: 08/20/2011 7:10 AM End time: 08/20/2011 7:19 AM Injection made incrementally with aspirations every 5 mL.  Performed by: Personally  Anesthesiologist: Sheldon Silvan

## 2011-08-20 NOTE — Preoperative (Signed)
Beta Blockers   Reason not to administer Beta Blockers:Not Applicable 

## 2011-08-20 NOTE — Interval H&P Note (Signed)
History and Physical Interval Note:   08/20/2011   7:02 AM   Melvin Montoya  has presented today for surgery, with the diagnosis of osteoarthritis  The various methods of treatment have been discussed with the patient and family. After consideration of risks, benefits and other options for treatment, the patient has consented to  Procedure(s): TOTAL KNEE ARTHROPLASTY as a surgical intervention .  The patients' history has been reviewed, patient examined, no change in status, stable for surgery.  I have reviewed the patients' chart and labs.  Questions were answered to the patient's satisfaction.     [name]  MD

## 2011-08-21 ENCOUNTER — Encounter (HOSPITAL_COMMUNITY): Payer: Self-pay | Admitting: Orthopedic Surgery

## 2011-08-21 LAB — GLUCOSE, CAPILLARY: Glucose-Capillary: 150 mg/dL — ABNORMAL HIGH (ref 70–99)

## 2011-08-21 LAB — BASIC METABOLIC PANEL
BUN: 16 mg/dL (ref 6–23)
Chloride: 100 mEq/L (ref 96–112)
GFR calc Af Amer: 81 mL/min — ABNORMAL LOW (ref >90)
GFR calc non Af Amer: 70 mL/min — ABNORMAL LOW (ref >90)
Potassium: 4.2 mEq/L (ref 3.5–5.1)
Sodium: 132 mEq/L — ABNORMAL LOW (ref 135–145)

## 2011-08-21 LAB — CBC
HCT: 33.3 % — ABNORMAL LOW (ref 39.0–52.0)
MCHC: 32.1 g/dL (ref 30.0–36.0)
RDW: 14.4 % (ref 11.5–15.5)
WBC: 8 10*3/uL (ref 4.0–10.5)

## 2011-08-21 LAB — PROTIME-INR
INR: 1.1 (ref 0.00–1.49)
Prothrombin Time: 14.4 seconds (ref 11.6–15.2)

## 2011-08-21 MED ORDER — WARFARIN SODIUM 7.5 MG PO TABS
7.5000 mg | ORAL_TABLET | Freq: Once | ORAL | Status: AC
  Administered 2011-08-21: 7.5 mg via ORAL
  Filled 2011-08-21: qty 1

## 2011-08-21 MED ORDER — PNEUMOCOCCAL VAC POLYVALENT 25 MCG/0.5ML IJ INJ
0.5000 mL | INJECTION | INTRAMUSCULAR | Status: DC
  Filled 2011-08-21: qty 0.5

## 2011-08-21 NOTE — Progress Notes (Signed)
Physical Therapy Treatment Patient Details Name: Melvin Montoya MRN: [id number] DOB: March 20, 1951 Today's Date: 08/21/2011  PT Assessment/Plan  PT - Assessment/Plan Comments on Treatment Session: Pt progressing well, increased ambulation distance this afternoon as well as increased strength of quadriceps muscle with less buckling throughout gait. PT Plan: Discharge plan remains appropriate PT Frequency: 7X/week Follow Up Recommendations: Home health PT Equipment Recommended: None recommended by PT PT Goals  Acute Rehab PT Goals PT Goal Formulation: With patient Time For Goal Achievement: 7 days PT Goal: Supine/Side to Sit - Progress: Progressing toward goal PT Goal: Sit to Supine/Side - Progress: Progressing toward goal PT Transfer Goal: Sit to Stand/Stand to Sit - Progress: Progressing toward goal PT Transfer Goal: Bed to Chair/Chair to Bed - Progress: Progressing toward goal PT Goal: Ambulate - Progress: Progressing toward goal PT Goal: Up/Down Stairs - Progress: Other (comment) (unassessed today) Pt will Perform Home Exercise Program: Independently PT Goal: Perform Home Exercise Program - Progress: Progressing toward goal  PT Treatment Precautions/Restrictions  Restrictions Weight Bearing Restrictions: Yes LLE Weight Bearing: Weight bearing as tolerated Mobility (including Balance) Bed Mobility Bed Mobility: Yes Supine to Sit: 4: Min assist;HOB flat;With rails Sitting - Scoot to Edge of Bed: 5: Supervision Sit to Supine - Left: 4: Min assist Sit to Supine - Left Details (indicate cue type and reason): Assist with LLE. VC for sequencing Transfers Transfers: Yes Sit to Stand: 4: Min assist;From chair/3-in-1;With upper extremity assist Sit to Stand Details (indicate cue type and reason): VC for hand placement for safety Stand to Sit: 4: Min assist;With upper extremity assist;To bed Stand to Sit Details: VC for hand placement and safety Ambulation/Gait Ambulation/Gait:  Yes Ambulation/Gait Assistance: 4: Min assist (Minguard assist) Ambulation/Gait Assistance Details (indicate cue type and reason): VC for proper gait sequencing and safety with distance to RW. Ambulation Distance (Feet): 150 Feet Assistive device: Rolling walker Gait Pattern: Decreased step length - right;Decreased stance time - left;Decreased hip/knee flexion - left;Decreased weight shift to left Gait velocity: Decreased gait speed Stairs: No  Balance Balance Assessed: Yes Static Standing Balance Static Standing - Balance Support: Left upper extremity supported (holding onto rail next to toilet) Static Standing - Level of Assistance: 5: Stand by assistance Static Standing - Comment/# of Minutes: 3 (standing while going to the bathroom) Exercise  Total Joint Exercises Heel Slides: AROM;Seated;Strengthening;Left;10 reps Hip ABduction/ADduction: AROM;Left;10 reps;Strengthening;Supine Straight Leg Raises: AROM;Left;10 reps;Strengthening;Supine Long Arc Quad: AROM;Left;10 reps;Strengthening;Seated Knee Flexion: Other (comment);AAROM (0-70 degrees) End of Session PT - End of Session Equipment Utilized During Treatment: Gait belt Activity Tolerance: Patient tolerated treatment well Patient left: in CPM;in bed;with call bell in reach (CPM 0-65 degrees) Nurse Communication: Mobility status for transfers;Mobility status for ambulation General Behavior During Session: [employer] for tasks performed Cognition: [facility] for tasks performed  Milana Kidney 08/21/2011, 4:04 PM

## 2011-08-21 NOTE — Progress Notes (Signed)
ANTICOAGULATION CONSULT NOTE - Follow Up Consult  Pharmacy Consult for coumadin Indication: VTE prophylaxis   Labs:  [facility] 08/21/11 0635  HGB 10.7*  HCT 33.3*  PLT 144*  APTT --  LABPROT 14.4  INR 1.10  HEPARINUNFRC --  CREATININE 1.11  CKTOTAL --  CKMB --  TROPONINI --    Assessment: 60 YOM s/p L total knee arthroplasty on coumadin/lovenox for DVT prophylaxis. INR subtherapeutic, but trending up   Goal of Therapy:  1.5-2 (per Dr. Turner Daniels)   Plan:  1) Coumadin 7.5mg  x1 2) f/u INR tomorrow  Melvin Montoya 08/21/2011,9:05 AM

## 2011-08-21 NOTE — Progress Notes (Signed)
Patient ID: Melvin Montoya, male   DOB: May 02, 1951, 60 y.o.   MRN: [id number] PATIENT ID: Melvin Montoya  MRN: [id number]  DOB/AGE:  1951/04/26 / 60 y.o.  1 Day Post-Op Procedure(s) (LRB): TOTAL KNEE ARTHROPLASTY (Left)    PROGRESS NOTE Subjective: Patient is alert, oriented, no Nausea, 1 x Vomiting last nite, yes passing gas, no Bowel Movement. Taking PO liquids now. Denies SOB, Chest or Calf Pain. Using Incentive Spirometer, PAS in place. Ambulate today wbat, CPM 0-30 Patient reports pain as 8 on 0-10 scale  .    Objective: Vital signs in last 24 hours: Filed Vitals:   08/20/11 1220 08/20/11 1507 08/20/11 2120 08/21/11 0630  BP: 125/75 123/73 90/53 116/70  Pulse: 84 87 82 86  Temp: 97.8 F (36.6 C) 97.9 F (36.6 C) 99.1 F (37.3 C) 97.9 F (36.6 C)  TempSrc:   Oral   Resp: 20 20 18 16   SpO2: 100% 100% 90% 97%      Intake/Output from previous day: I/O last 3 completed shifts: In: 4020 [P.O.:240; I.V.:3300; Other:480] Out: 1810 [Urine:1550; Drains:250; Blood:10]   Intake/Output this shift:     LABORATORY DATA:  Basename 08/21/11 0635  WBC 8.0  HGB 10.7*  HCT 33.3*  PLT 144*  NA 132*  K 4.2  CL 100  CO2 26  BUN 16  CREATININE 1.11  GLUCOSE 143*  INR 1.10  CALCIUM 8.1*    Examination: ABD soft Neurovascular intact Sensation intact distally Intact pulses distally Dorsiflexion/Plantar flexion intact Incision: dressing C/D/I No cellulitis present Compartment soft} Blood plasma separated in drain, drain pulled Assessment:   1 Day Post-Op Procedure(s) (LRB): TOTAL KNEE ARTHROPLASTY (Left) ADDITIONAL DIAGNOSIS:  Diabetes  Plan: PT/OT WBAT, CPM 5/hrs day until ROM 0-90 degrees, then D/C CPM DVT Prophylaxis:  Lovenox\Coumadin bridge target INR 1.5-2.0 DISCHARGE PLAN: Home DISCHARGE NEEDS: HHPT, HHRN, CPM, Walker and 3-in-1 comode seat     Robinn Overholt J 08/21/2011, 8:34 AM

## 2011-08-21 NOTE — Progress Notes (Signed)
Physical Therapy Evaluation Patient Details Name: Melvin Montoya MRN: [id number] DOB: [birth year] Today's Date: 08/21/2011  Problem List:  Patient Active Problem List  Diagnoses  . Degenerative arthritis of left knee    Past Medical History:  Past Medical History  Diagnosis Date  . Arthritis   . Chronic kidney disease     PT ON LISINOPRIL WATCHING FUNCT  . Diabetes mellitus    Past Surgical History:  Past Surgical History  Procedure Date  . Cholecystectomy 1980  . Wrist surgery 2005    LEFT  . Knee arthroscopy     BILAT  . Hernia repair 2010    UMBILICAL     PT Assessment/Plan/Recommendation PT Assessment Clinical Impression Statement: Pt presents with a medical diagnosis of Left TKA along with the following impairments/deficits and therapy diagnosis listed below. Pt will benefit from skilled PT in the acute care setting in order to maximize functional mobility for a safe d/c home. PT Recommendation/Assessment: Patient will need skilled PT in the acute care venue PT Problem List: Decreased strength;Decreased range of motion;Decreased activity tolerance;Decreased mobility;Decreased knowledge of use of DME;Pain;Decreased knowledge of precautions PT Therapy Diagnosis : Acute pain;Abnormality of gait PT Plan PT Frequency: 7X/week PT Treatment/Interventions: DME instruction;Gait training;Stair training;Functional mobility training;Therapeutic exercise;Patient/family education PT Recommendation Follow Up Recommendations: Home health PT Equipment Recommended: None recommended by PT PT Goals  Acute Rehab PT Goals PT Goal Formulation: With patient Time For Goal Achievement: 7 days Pt will go Supine/Side to Sit: with modified independence PT Goal: Supine/Side to Sit - Progress: Progressing toward goal Pt will go Sit to Supine/Side: with modified independence PT Goal: Sit to Supine/Side - Progress: Progressing toward goal Pt will Transfer Sit to Stand/Stand to Sit: with  modified independence PT Transfer Goal: Sit to Stand/Stand to Sit - Progress: Progressing toward goal Pt will Transfer Bed to Chair/Chair to Bed: with modified independence PT Transfer Goal: Bed to Chair/Chair to Bed - Progress: Progressing toward goal Pt will Ambulate: >150 feet;with modified independence;with rolling walker PT Goal: Ambulate - Progress: Progressing toward goal Pt will Go Up / Down Stairs: 1-2 stairs;with rail(s);with min assist PT Goal: Up/Down Stairs - Progress: Progressing toward goal Pt will Perform Home Exercise Program: Independently PT Goal: Perform Home Exercise Program - Progress: Progressing toward goal  PT Evaluation Precautions/Restrictions  Restrictions Weight Bearing Restrictions: Yes LLE Weight Bearing: Weight bearing as tolerated Prior Functioning  Home Living Lives With: Spouse Receives Help From: Family Type of Home: House Home Layout: One level Home Access: Stairs to enter Entrance Stairs-Rails: None Entrance Stairs-Number of Steps: 1 Bathroom Shower/Tub: Psychologist, counselling;Door [employer] Toilet: Standard Bathroom Accessibility: Yes How Accessible: Accessible via walker Home Adaptive Equipment: Bedside commode/3-in-1;Walker - rolling Prior Function Level of Independence: Needs assistance with ADLs;Independent with gait;Independent with transfers Dressing: Moderate (assist with tying shoes) Able to Take Stairs?: Yes Driving: Yes Vocation: Retired Financial risk analyst Arousal/Alertness: Awake/alert Overall Cognitive Status: Appears within functional limits for tasks assessed Orientation Level: Oriented X4 Sensation/Coordination Sensation Light Touch: Impaired Detail Light Touch Impaired Details: Absent LLE (impaired sensation down to mid calf(nerve block still intact) Extremity Assessment RLE Assessment RLE Assessment: Within Functional Limits LLE Assessment LLE Assessment: Exceptions to WFL LLE AROM (degrees) Overall AROM Left Lower  Extremity: Deficits;Due to pain (Hip and Ankle WFL) LLE Strength LLE Overall Strength: Deficits;Due to pain (Hip and Ankle WFL) Mobility (including Balance) Bed Mobility Bed Mobility: No Transfers Transfers: Yes Sit to Stand: 3: Mod assist;From chair/3-in-1;With upper extremity assist Sit to Stand  Details (indicate cue type and reason): VC for sequencing, and hand placement. Pt with difficulty standing secondary to increased pain upon knee extension Stand to Sit: 4: Min assist;With upper extremity assist;To chair/3-in-1 Stand to Sit Details: VC for sequencing and hand placement as well as LLE placement Ambulation/Gait Ambulation/Gait: Yes Ambulation/Gait Assistance: 4: Min assist (Minguard assist) Ambulation/Gait Assistance Details (indicate cue type and reason): VC for gait sequencing and equal step length as well as safety with RW. Ambulation Distance (Feet): 50 Feet Assistive device: Rolling walker Gait Pattern: Decreased step length - right;Decreased stance time - left;Decreased hip/knee flexion - left;Decreased weight shift to left Gait velocity: Decreased gait speed Stairs: No    Exercise  Total Joint Exercises Ankle Circles/Pumps: AROM;10 reps;Both;Strengthening;Supine Quad Sets: AROM;Left;10 reps;Supine;Strengthening Straight Leg Raises: AROM;Left;Strengthening;10 reps;Supine End of Session PT - End of Session Equipment Utilized During Treatment: Gait belt Activity Tolerance: Patient tolerated treatment well Patient left: in chair;with call bell in reach Nurse Communication: Mobility status for transfers;Mobility status for ambulation General Behavior During Session: [facility] for tasks performed Cognition: [facility] for tasks performed  Milana Kidney 08/21/2011, 12:23 PM  08/21/2011 Milana Kidney DPT PAGER: [phone] OFFICE: [phone]

## 2011-08-21 NOTE — Progress Notes (Signed)
Occupational Therapy Evaluation Patient Details Name: Melvin Montoya MRN: [id number] DOB: Jan 26, 1951 Today's Date: 08/21/2011  Problem List:  Patient Active Problem List  Diagnoses  . Degenerative arthritis of left knee    Past Medical History:  Past Medical History  Diagnosis Date  . Arthritis   . Chronic kidney disease     PT ON LISINOPRIL WATCHING FUNCT  . Diabetes mellitus    Past Surgical History:  Past Surgical History  Procedure Date  . Cholecystectomy 1980  . Wrist surgery 2005    LEFT  . Knee arthroscopy     BILAT  . Hernia repair 2010    UMBILICAL     OT Assessment/Plan/Recommendation OT Assessment Clinical Impression Statement: Pt will benefit from OT services in the acute care setting to increase I with ADLs and functional transfers in prep for d/c home with wife. OT Recommendation/Assessment: Patient will need skilled OT in the acute care venue OT Problem List: Decreased activity tolerance;Decreased knowledge of use of DME or AE;Pain OT Therapy Diagnosis : Acute pain OT Plan OT Frequency: Min 2X/week OT Treatment/Interventions: Self-care/ADL training;DME and/or AE instruction;Therapeutic activities;Patient/family education OT Recommendation Follow Up Recommendations: None Equipment Recommended: None recommended by OT Individuals Consulted Consulted and Agree with Results and Recommendations: Patient OT Goals Acute Rehab OT Goals OT Goal Formulation: With patient ADL Goals Pt Will Perform Grooming: with modified independence;Standing at sink;Unsupported;Other (comment) (3 grooming tasks) ADL Goal: Grooming - Progress: Other (comment) Pt Will Perform Lower Body Bathing: with modified independence;Sit to stand from bed ADL Goal: Lower Body Bathing - Progress: Other (comment) Pt Will Perform Lower Body Dressing: with modified independence;Sit to stand from bed ADL Goal: Lower Body Dressing - Progress: Other (comment) Pt Will Transfer to Toilet: with  modified independence;Ambulation;with DME;3-in-1 ADL Goal: Toilet Transfer - Progress: Other (comment) Pt Will Perform Toileting - Clothing Manipulation: with modified independence;Standing ADL Goal: Toileting - Clothing Manipulation - Progress: Other (comment) Miscellaneous OT Goals Miscellaneous OT Goal #1: Pt will perform bed mobility with mod I with HOB flat in prep for EOB ADLs. OT Goal: Miscellaneous Goal #1 - Progress: Other (comment)  OT Evaluation Precautions/Restrictions  Restrictions Weight Bearing Restrictions: Yes LLE Weight Bearing: Weight bearing as tolerated Prior Functioning Home Living Lives With: Spouse Receives Help From: Family Type of Home: House Home Layout: One level Home Access: Stairs to enter Entrance Stairs-Rails: None Entrance Stairs-Number of Steps: 1 Bathroom Shower/Tub: Psychologist, counselling;Door [employer] Toilet: Standard Bathroom Accessibility: Yes How Accessible: Accessible via walker Home Adaptive Equipment: Bedside commode/3-in-1;Walker - rolling Prior Function Level of Independence: Needs assistance with ADLs;Independent with gait;Independent with transfers Dressing: Minimal Able to Take Stairs?: Yes Driving: Yes Vocation: Retired ADL ADL Eating/Feeding: Simulated;Independent Where Assessed - Eating/Feeding: Chair Grooming: Performed;Supervision/safety Grooming Details (indicate cue type and reason): Supervision for safety while standing at sink Where Assessed - Grooming: Standing at sink Upper Body Bathing: Simulated;Set up Upper Body Bathing Details (indicate cue type and reason): Setup assist for gathering bathing materials. Where Assessed - Upper Body Bathing: Sitting, bed;Unsupported Lower Body Bathing: Simulated;Minimal assistance Where Assessed - Lower Body Bathing: Sit to stand from bed Upper Body Dressing: Simulated;Set up Upper Body Dressing Details (indicate cue type and reason): Setup assist to gather clothing items Where  Assessed - Upper Body Dressing: Sitting, bed;Unsupported Lower Body Dressing: Simulated;Minimal assistance Where Assessed - Lower Body Dressing: Sit to stand from bed Toilet Transfer: Performed;Minimal assistance Toilet Transfer Method: Ambulating Toilet Transfer Equipment: Other (comment) (Pt stood at toilet to urinate) Toileting -  Clothing Manipulation: Performed;Supervision/safety Toileting - Clothing Manipulation Details (indicate cue type and reason): Pt pulled gown up in standing with supervision for safety Where Assessed - Toileting Clothing Manipulation: Standing Toileting - Hygiene: Performed;Supervision/safety Where Assessed - Toileting Hygiene: Standing Tub/Shower Transfer: Not assessed Equipment Used: Rolling walker ADL Comments: Pt requires increased time to perform functional transfers due to pain and decreased activity tolerance. Vision/Perception    Cognition Cognition Arousal/Alertness: Awake/alert Overall Cognitive Status: Appears within functional limits for tasks assessed Orientation Level: Oriented X4 Sensation/Coordination Sensation Light Touch: Impaired Detail Light Touch Impaired Details: Absent LLE (impaired sensation down to mid calf(nerve block still intact) Extremity Assessment RUE Assessment RUE Assessment: Within Functional Limits LUE Assessment LUE Assessment: Within Functional Limits Mobility  Bed Mobility Bed Mobility: Yes Supine to Sit: 4: Min assist;HOB flat;With rails Sitting - Scoot to Edge of Bed: 5: Supervision Transfers Transfers: Yes Sit to Stand: 3: Mod assist;From bed;From chair/3-in-1;With upper extremity assist;With armrests Sit to Stand Details (indicate cue type and reason): VC for sequencing and hand placement.  Stand to Sit: 4: Min assist;With upper extremity assist;To chair/3-in-1 Stand to Sit Details: VC for sequencing and hand placement as well as LLE placement Exercises  End of Session General Behavior During Session:  [facility] for tasks performed Cognition: [facility] for tasks performed   Cipriano Mile 08/21/2011, 2:44 PM  08/21/2011 Cipriano Mile OTR/L Pager [phone] Office [phone]

## 2011-08-21 NOTE — Plan of Care (Signed)
Problem: Phase II Progression Outcomes Goal: Discharge plan established Outcome: Progressing Plans to d/c home

## 2011-08-22 LAB — CBC
HCT: 33.2 % — ABNORMAL LOW (ref 39.0–52.0)
Hemoglobin: 11 g/dL — ABNORMAL LOW (ref 13.0–17.0)
MCH: 26.9 pg (ref 26.0–34.0)
MCHC: 33.1 g/dL (ref 30.0–36.0)
MCV: 81.2 fL (ref 78.0–100.0)
RBC: 4.09 MIL/uL — ABNORMAL LOW (ref 4.22–5.81)

## 2011-08-22 LAB — GLUCOSE, CAPILLARY
Glucose-Capillary: 143 mg/dL — ABNORMAL HIGH (ref 70–99)
Glucose-Capillary: 132 mg/dL — ABNORMAL HIGH (ref 70–99)

## 2011-08-22 LAB — PROTIME-INR: Prothrombin Time: 19.1 seconds — ABNORMAL HIGH (ref 11.6–15.2)

## 2011-08-22 MED ORDER — WARFARIN SODIUM 5 MG PO TABS
5.0000 mg | ORAL_TABLET | Freq: Once | ORAL | Status: AC
  Administered 2011-08-22: 5 mg via ORAL
  Filled 2011-08-22: qty 1

## 2011-08-22 NOTE — Progress Notes (Signed)
Was notified that Pt was feeling dizzy. Pt was sitting on commode accompanied by NT. Pt clammy and nauseated. Pt was attempting to have a BM and per Pt just started feeling dizzy and sick. BP taking while on the commode was 65/38 automatic. Pt helped onto recliner, BP then 98/60 automatic. BP was rechecked 30 mins afterward with pt in recliner 112/83. BP this am 121/76. PA on call notified. No new orders given. Will continue to monitor pt. Elly Modena

## 2011-08-22 NOTE — Progress Notes (Signed)
Utilization review completed. Marna Weniger, RN, BSN. 08/22/11  

## 2011-08-22 NOTE — Progress Notes (Signed)
PATIENT ID: Melvin Montoya  MRN: [id number]  DOB/AGE:  Apr 04, 1951 / 60 y.o.  2 Days Post-Op Procedure(s) (LRB): TOTAL KNEE ARTHROPLASTY (Left)    PROGRESS NOTE Subjective: Patient is alert, oriented, no Nausea, no Vomiting, yes passing gas, no Bowel Movement. Taking PO well. Denies SOB, Chest or Calf Pain. Using Incentive Spirometer, PAS in place. Ambulating well, CPM 0-65 Patient reports pain as moderate  .    Objective: Vital signs in last 24 hours: Filed Vitals:   08/21/11 1500 08/21/11 2202 08/21/11 2239 08/22/11 0516  BP: 113/66 128/68 115/75 121/76  Pulse: 87  89 92  Temp: 98.1 F (36.7 C)  100.2 F (37.9 C) 97.6 F (36.4 C)  TempSrc:   Oral Oral  Resp: 20  16 18   SpO2: 100%  98% 94%      Intake/Output from previous day: I/O last 3 completed shifts: In: 1740 [P.O.:240; I.V.:1500] Out: 1250 [Urine:1150; Drains:100]   Intake/Output this shift:     LABORATORY DATA:  Basename 08/22/11 0712 08/22/11 0610 08/21/11 2227 08/21/11 1645 08/21/11 0635  WBC -- 12.4* -- -- 8.0  HGB -- 11.0* -- -- 10.7*  HCT -- 33.2* -- -- 33.3*  PLT -- 134* -- -- 144*  NA -- -- -- -- 132*  K -- -- -- -- 4.2  CL -- -- -- -- 100  CO2 -- -- -- -- 26  BUN -- -- -- -- 16  CREATININE -- -- -- -- 1.11  GLUCOSE -- -- -- -- 143*  GLUCAP 143* -- 149* 100* --  INR -- 1.57* -- -- 1.10  CALCIUM -- -- -- -- 8.1*    Examination: Neurologically intact ABD soft Neurovascular intact Sensation intact distally Intact pulses distally Incision: dressing C/D/I}  Assessment:   2 Days Post-Op Procedure(s) (LRB): TOTAL KNEE ARTHROPLASTY (Left) ADDITIONAL DIAGNOSIS:  none  Plan: PT/OT WBAT, CPM 5/hrs day until ROM 0-90 degrees, then D/C CPM DVT Prophylaxis:  Lovenox\Coumadin bridge target INR 1.5-2.0 DISCHARGE PLAN: Home DISCHARGE NEEDS: HHPT, HHRN, CPM, Walker and 3-in-1 comode seat     [name] M. 08/22/2011, 8:16 AM   \

## 2011-08-22 NOTE — Progress Notes (Signed)
Occupational Therapy Treatment Patient Details Name: CASMER YEPIZ MRN: [id number] DOB: 1951/01/31 Today's Date: 08/22/2011  OT Assessment/Plan OT Assessment/Plan Comments on Treatment Session: Pt progressing towards goals.  Pt educated on LB dressing and had previously donned pants with wife with min-mod A prior to session. Equipment Recommended: None recommended by OT OT Goals ADL Goals Pt Will Perform Grooming: with modified independence;Standing at sink;Unsupported;Other (comment) ADL Goal: Grooming - Progress: Not addressed Pt Will Perform Lower Body Bathing: with modified independence;Sit to stand from bed ADL Goal: Lower Body Bathing - Progress: Not addressed Pt Will Perform Lower Body Dressing: with modified independence;Sit to stand from bed ADL Goal: Lower Body Dressing - Progress: Progressing toward goals Pt Will Transfer to Toilet: with modified independence;Ambulation;with DME;3-in-1 ADL Goal: Toilet Transfer - Progress: Progressing toward goals Pt Will Perform Toileting - Clothing Manipulation: with modified independence;Standing ADL Goal: Toileting - Clothing Manipulation - Progress: Not addressed Miscellaneous OT Goals Miscellaneous OT Goal #1: Pt will perform bed mobility with mod I with HOB flat in prep for EOB ADLs. OT Goal: Miscellaneous Goal #1 - Progress: Progressing toward goals  OT Treatment Precautions/Restrictions  Restrictions Weight Bearing Restrictions: Yes LLE Weight Bearing: Weight bearing as tolerated   ADL ADL Lower Body Dressing: Performed;Set up Lower Body Dressing Details (indicate cue type and reason): Pt donned shoes without use of hands  Where Assessed - Lower Body Dressing: Sitting, bed Toilet Transfer: Simulated;Minimal assistance Toilet Transfer Details (indicate cue type and reason): Simulated with bed to chair transfer Toilet Transfer Method: Ambulating Mobility  Bed Mobility Bed Mobility: Yes Supine to Sit: 4: Min assist;HOB  flat;With rails Sitting - Scoot to Edge of Bed: 5: Supervision Transfers Transfers: Yes Sit to Stand: 4: Min assist;From bed;With upper extremity assist Sit to Stand Details (indicate cue type and reason): VC for sequencing. Assist with weight into standing secondary to soreness of leg and stiffness Stand to Sit: 5: Supervision;With upper extremity assist;To chair/3-in-1 Stand to Sit Details: VC for hand placement Exercises  End of Session General Behavior During Session: [facility] for tasks performed Cognition: [facility] for tasks performed  Cipriano Mile  08/22/2011, 2:16 PM 08/22/2011 Cipriano Mile OTR/L Pager [phone] Office [phone]

## 2011-08-22 NOTE — Progress Notes (Signed)
Physical Therapy Treatment Patient Details Name: Melvin Montoya MRN: [id number] DOB: 1951/04/24 Today's Date: 08/22/2011  PT Assessment/Plan  PT - Assessment/Plan Comments on Treatment Session: Pt able to tolerate stairs this session, and required only minimal assist and cueing throughout. Will practice stairs again tomorrow and exercises to assist with increased knee extension strength. PT Plan: Discharge plan remains appropriate PT Frequency: 7X/week Follow Up Recommendations: Home health PT Equipment Recommended: None recommended by PT PT Goals  Acute Rehab PT Goals PT Goal Formulation: With patient Time For Goal Achievement: 7 days PT Goal: Supine/Side to Sit - Progress: Progressing toward goal PT Goal: Sit to Supine/Side - Progress: Progressing toward goal PT Transfer Goal: Sit to Stand/Stand to Sit - Progress: Progressing toward goal PT Transfer Goal: Bed to Chair/Chair to Bed - Progress: Progressing toward goal PT Goal: Ambulate - Progress: Progressing toward goal PT Goal: Up/Down Stairs - Progress: Met PT Goal: Perform Home Exercise Program - Progress: Progressing toward goal  PT Treatment Precautions/Restrictions  Restrictions Weight Bearing Restrictions: Yes LLE Weight Bearing: Weight bearing as tolerated Mobility (including Balance) Bed Mobility Bed Mobility: Yes Supine to Sit: 4: Min assist;HOB flat;With rails Sitting - Scoot to Edge of Bed: 5: Supervision Sit to Supine - Left: 4: Min assist Sit to Supine - Left Details (indicate cue type and reason): VC for sequencing with assist of RLE for compensation. Pt still required assist of LLE secondary to difficulty and pain Transfers Transfers: Yes Sit to Stand: 4: Min assist;From chair/3-in-1;With upper extremity assist Sit to Stand Details (indicate cue type and reason): VC for hand placement Stand to Sit: 5: Supervision;With upper extremity assist;To bed;To chair/3-in-1 Stand to Sit Details: VC for proper  sequencing Ambulation/Gait Ambulation/Gait: Yes Ambulation/Gait Assistance: 4: Min assist;5: Supervision (Supervision- Minguard assist) Ambulation/Gait Assistance Details (indicate cue type and reason): VC for proper gait sequencing, increased knee extension, postural cues, and safety with RW. Pt with increased safety this session, although still required cueing throughout Ambulation Distance (Feet): 100 Feet Assistive device: Rolling walker Gait Pattern: Decreased step length - right;Decreased stance time - left;Decreased weight shift to left;Step-to pattern;Trunk flexed (Decreased knee extension- left) Gait velocity: Decreased gait speed Stairs: Yes Stairs Assistance: 3: Mod assist Stairs Assistance Details (indicate cue type and reason): VC for proper gait sequencing and reminders throughout. Assist with balance of RW during stairs Stair Management Technique: Backwards;With walker Number of Stairs: 2     Exercise    End of Session General Behavior During Session: [facility] for tasks performed Cognition: [facility] for tasks performed  Milana Kidney 08/22/2011, 3:39 PM 08/22/2011 Milana Kidney DPT PAGER: [phone] OFFICE: [phone]

## 2011-08-22 NOTE — Progress Notes (Signed)
ANTICOAGULATION CONSULT NOTE - Follow Up Consult  Pharmacy Consult for coumadin Indication: VTE prophylaxis  No Known Allergies  Vital Signs: Temp: 97.6 F (36.4 C) (11/28 0516) Temp src: Oral (11/28 0516) BP: 121/76 mmHg (11/28 0516) Pulse Rate: 92  (11/28 0516)  Labs:  Basename 08/22/11 0610 08/21/11 0635  HGB 11.0* 10.7*  HCT 33.2* 33.3*  PLT 134* 144*  APTT -- --  LABPROT 19.1* 14.4  INR 1.57* 1.10  HEPARINUNFRC -- --  CREATININE -- 1.11  CKTOTAL -- --  CKMB -- --  TROPONINI -- --   CrCl is unknown because there is no height on file for the current visit.   Medications:  Scheduled:    . docusate sodium  100 mg Oral BID  . dutasteride  0.5 mg Oral QHS  . enoxaparin  30 mg Subcutaneous Q12H  . ezetimibe  10 mg Oral Daily  . fluticasone  1 spray Each Nare Daily  . insulin aspart  0-15 Units Subcutaneous TID WC  . lisinopril  20 mg Oral QHS  . multivitamins ther. w/minerals  1 tablet Oral QHS  . pioglitazone  30 mg Oral QHS  . pneumococcal 23 valent vaccine  0.5 mL Intramuscular Tomorrow-1000  . simvastatin  20 mg Oral Daily  . Tamsulosin HCl  0.4 mg Oral QHS  . warfarin  7.5 mg Oral ONCE-1800  . warfarin   Does not apply Once  . DISCONTD: pneumococcal 23 valent vaccine  0.5 mL Intramuscular Tomorrow-1000    Assessment: 60 yom started on coumadin for VTE prophylaxis s/p L TKA. INR goal is 1.5-2 per MD and INR is at goal today at 1.57. Pt is also on lovenox 30mg  SQ Q12H. No bleeding noted. Mild thrombocytopeni + anemia.    Goal of Therapy:  INR 1.5-2   Plan:  Coumadin 5mg  PO x 1 tonight F/u AM INR Consider DC lovenox since INR is at specified goal  Melvin Montoya, Drake Leach 08/22/2011,8:48 AM

## 2011-08-22 NOTE — Progress Notes (Signed)
Physical Therapy Treatment Patient Details Name: Melvin Montoya MRN: [id number] DOB: Dec 13, 1950 Today's Date: 08/22/2011  PT Assessment/Plan  PT - Assessment/Plan Comments on Treatment Session: Pt progressing well. Pt with increased soreness today limiting knee extension functionally. Will attempt stairs next session pending pt progress PT Plan: Discharge plan remains appropriate PT Frequency: 7X/week Follow Up Recommendations: Home health PT Equipment Recommended: None recommended by PT PT Goals  Acute Rehab PT Goals PT Goal Formulation: With patient Time For Goal Achievement: 7 days PT Goal: Supine/Side to Sit - Progress: Progressing toward goal PT Goal: Sit to Supine/Side - Progress: Progressing toward goal PT Transfer Goal: Sit to Stand/Stand to Sit - Progress: Progressing toward goal PT Transfer Goal: Bed to Chair/Chair to Bed - Progress: Progressing toward goal PT Goal: Ambulate - Progress: Progressing toward goal PT Goal: Up/Down Stairs - Progress: Other (comment) (unassessed today) PT Goal: Perform Home Exercise Program - Progress: Progressing toward goal  PT Treatment Precautions/Restrictions  Restrictions Weight Bearing Restrictions: Yes LLE Weight Bearing: Weight bearing as tolerated Mobility (including Balance) Bed Mobility Bed Mobility: Yes Sit to Supine - Left: 4: Min assist Sit to Supine - Left Details (indicate cue type and reason): VC for sequencing and assist with LLE. Transfers Transfers: Yes Sit to Stand: 3: Mod assist;From chair/3-in-1;With upper extremity assist Sit to Stand Details (indicate cue type and reason): VC for sequencing. Assist with weight into standing secondary to soreness of leg and stiffness Stand to Sit: 5: Supervision;With upper extremity assist;To bed Stand to Sit Details: VC for hand placement Ambulation/Gait Ambulation/Gait: Yes Ambulation/Gait Assistance: 4: Min assist (Minguard assist) Ambulation/Gait Assistance Details  (indicate cue type and reason): VC for increased knee extension during heel strike as well as safety to RW distance. Postural cues throughout ambulation. Ambulation Distance (Feet): 150 Feet Assistive device: Rolling walker Gait Pattern: Decreased step length - right;Decreased stance time - left;Decreased weight shift to left;Step-to pattern (Decreased knee extension left) Gait velocity: Decreased gait speed Stairs: No (will attempt next session)    Exercise  Total Joint Exercises Quad Sets: AROM;AAROM;10 reps;Left;Supine (to increased knee extension throughout gait) Gluteal Sets: Supine;10 reps;Left;Strengthening;AROM (to increased knee extension throughout gait) End of Session PT - End of Session Equipment Utilized During Treatment: Gait belt Activity Tolerance: Patient tolerated treatment well Patient left: in bed;with call bell in reach Nurse Communication: Mobility status for transfers General Behavior During Session: [facility] for tasks performed Cognition: [facility] for tasks performed  Milana Kidney 08/22/2011, 11:52 AM  08/22/2011 Milana Kidney DPT PAGER: [phone] OFFICE: [phone]

## 2011-08-23 LAB — CBC
HCT: 31.2 % — ABNORMAL LOW (ref 39.0–52.0)
MCH: 26.9 pg (ref 26.0–34.0)
MCV: 81.5 fL (ref 78.0–100.0)
RDW: 14.3 % (ref 11.5–15.5)
WBC: 9.3 10*3/uL (ref 4.0–10.5)

## 2011-08-23 LAB — GLUCOSE, CAPILLARY: Glucose-Capillary: 131 mg/dL — ABNORMAL HIGH (ref 70–99)

## 2011-08-23 MED ORDER — OXYCODONE-ACETAMINOPHEN 5-325 MG PO TABS: 1.0000 | ORAL_TABLET | ORAL | Status: AC | PRN

## 2011-08-23 MED ORDER — WARFARIN SODIUM 5 MG PO TABS: ORAL_TABLET | ORAL | Status: AC

## 2011-08-23 NOTE — Progress Notes (Signed)
Patient ID: EDVARDO HONSE, male   DOB: September 01, 1951, 60 y.o.   MRN: [id number] PATIENT ID: ISIAC BREIGHNER  MRN: [id number]  DOB/AGE:  1950/11/07 / 60 y.o.  3 Days Post-Op Procedure(s) (LRB): TOTAL KNEE ARTHROPLASTY (Left)    PROGRESS NOTE Subjective: Patient is alert, oriented, no Nausea, no Vomiting, yes passing gas, no Bowel Movement. Taking PO well. Denies SOB, Chest or Calf Pain. Using Incentive Spirometer, PAS in place. Ambulate hallway, CPM 0-40 Patient reports pain as 3 on 0-10 scale  .    Objective: Vital signs in last 24 hours: Filed Vitals:   08/22/11 2145 08/22/11 2157 08/22/11 2301 08/23/11 0654  BP:  118/72 136/71 111/68  Pulse:   95 98  Temp:   97.8 F (36.6 C) 97.6 F (36.4 C)  TempSrc:   Oral Oral  Resp:   20 22  Height: 5\' 7"  (1.702 m)     Weight: 107.2 kg (236 lb 5.3 oz)     SpO2:   99% 99%      Intake/Output from previous day: I/O last 3 completed shifts: In: 580 [P.O.:580] Out: 720 [Urine:720]   Intake/Output this shift:     LABORATORY DATA:  Basename 08/22/11 1644 08/22/11 1210 08/22/11 0712 08/22/11 0610 08/21/11 0635  WBC -- -- -- 12.4* 8.0  HGB -- -- -- 11.0* 10.7*  HCT -- -- -- 33.2* 33.3*  PLT -- -- -- 134* 144*  NA -- -- -- -- 132*  K -- -- -- -- 4.2  CL -- -- -- -- 100  CO2 -- -- -- -- 26  BUN -- -- -- -- 16  CREATININE -- -- -- -- 1.11  GLUCOSE -- -- -- -- 143*  GLUCAP 137* 132* 143* -- --  INR -- -- -- 1.57* 1.10  CALCIUM -- -- -- -- 8.1*    Examination: Neurologically intact ABD soft Neurovascular intact Sensation intact distally Intact pulses distally Dorsiflexion/Plantar flexion intact Incision: dressing C/D/I No cellulitis present Compartment soft}  Assessment:   3 Days Post-Op Procedure(s) (LRB): TOTAL KNEE ARTHROPLASTY (Left) ADDITIONAL DIAGNOSIS:  Diabetes  Plan: PT/OT WBAT, CPM 5/hrs day until ROM 0-90 degrees, then D/C CPM DVT Prophylaxis:  Lovenox\Coumadin bridge target INR 1.5-2.0 DISCHARGE PLAN: Home  today if passes PT DISCHARGE NEEDS: HHPT, HHRN, CPM, Walker and 3-in-1 comode seat     Duaine Radin J 08/23/2011, 7:45 AM

## 2011-08-23 NOTE — Discharge Summary (Signed)
Patient ID: Melvin Montoya MRN: [id number] DOB/AGE: [birth year] 60 y.o.  Admit date: 08/20/2011 Discharge date: 08/23/2011  Admission Diagnoses:  Principal Problem:  *Degenerative arthritis of left knee   Discharge Diagnoses:  Same  Past Medical History  Diagnosis Date  . Arthritis   . Chronic kidney disease     PT ON LISINOPRIL WATCHING FUNCT  . Diabetes mellitus     Surgeries: Procedure(s): TOTAL KNEE ARTHROPLASTY on 08/20/2011   Consultants:    Discharged Condition: Improved  Hospital Course: Melvin Montoya is an 60 y.o. male who was admitted 08/20/2011 for operative treatment ofDegenerative arthritis of left knee. Patient has severe unremitting pain that affects sleep, daily activities, and work/hobbies. After pre-op clearance the patient was taken to the operating room on 08/20/2011 and underwent  Procedure(s): TOTAL KNEE ARTHROPLASTY.    Patient was given perioperative antibiotics: Anti-infectives     Start     Dose/Rate Route Frequency Ordered Stop   08/20/11 0809   cefUROXime (ZINACEF) injection  Status:  Discontinued          As needed 08/20/11 0809 08/20/11 0930   08/20/11 0623   ceFAZolin (ANCEF) IVPB 2 g/50 mL premix  Status:  Discontinued     Comments: Weight 108.7 kg      2 g 100 mL/hr over 30 Minutes Intravenous 60 min pre-op 08/20/11 0624 08/20/11 1117   08/20/11 0612   ceFAZolin (ANCEF) 2-3 GM-% IVPB SOLR     Comments: WILHELM, JENNIFER: cabinet override         08/20/11 0612 08/20/11 0744   08/19/11 1145   ceFAZolin (ANCEF) IVPB 1 g/50 mL premix  Status:  Discontinued        1 g 100 mL/hr over 30 Minutes Intravenous 60 min pre-op 08/19/11 1133 08/20/11 0624           Patient was given sequential compression devices, early ambulation, and chemoprophylaxis to prevent DVT.  Patient benefited maximally from hospital stay and there were no complications.    Recent vital signs: Patient Vitals for the past 24 hrs:  BP Temp Temp src Pulse Resp  SpO2 Height Weight  08/23/11 0654 111/68 mmHg 97.6 F (36.4 C) Oral 98  22  99 % - -  08/22/11 2301 136/71 mmHg 97.8 F (36.6 C) Oral 95  20  99 % - -  08/22/11 2157 118/72 mmHg - - - - - - -  08/22/11 2145 - - - - - - 5\' 7"  (1.702 m) 107.2 kg (236 lb 5.3 oz)  08/22/11 2107 - - - - - - 5\' 7"  (1.702 m) 107 kg (235 lb 14.3 oz)  08/22/11 2017 - - - - - - 5\' 7"  (1.702 m) 107 kg (235 lb 14.3 oz)  08/22/11 1400 118/72 mmHg 97.1 F (36.2 C) - 89  18  96 % - -     Recent laboratory studies:  Basename 08/22/11 0610 09-20-11 0635  WBC 12.4* 8.0  HGB 11.0* 10.7*  HCT 33.2* 33.3*  PLT 134* 144*  NA -- 132*  K -- 4.2  CL -- 100  CO2 -- 26  BUN -- 16  CREATININE -- 1.11  GLUCOSE -- 143*  INR 1.57* 1.10  CALCIUM -- 8.1*     Discharge Medications:  Current Discharge Medication List    START taking these medications   Details  oxyCODONE-acetaminophen (PERCOCET) 5-325 MG per tablet Take 1-2 tablets by mouth every 4 (four) hours as needed. Qty: 60 tablet, Refills: 0  warfarin (COUMADIN) 5 MG tablet Take as directed based on INR testing Qty: 30 tablet, Refills: 0      CONTINUE these medications which have NOT CHANGED   Details  cyclobenzaprine (FLEXERIL) 10 MG tablet Take 10 mg by mouth 3 (three) times daily as needed. For back pain & spasms     diphenhydramine-acetaminophen (TYLENOL PM) 25-500 MG TABS Take 1 tablet by mouth at bedtime as needed. For pain     dutasteride (AVODART) 0.5 MG capsule Take 0.5 mg by mouth at bedtime.      ezetimibe-simvastatin (VYTORIN) 10-20 MG per tablet Take 1 tablet by mouth every morning.      lisinopril (PRINIVIL,ZESTRIL) 20 MG tablet Take 20 mg by mouth at bedtime.      loratadine-pseudoephedrine (CLARITIN-D 12-HOUR) 5-120 MG per tablet Take 2 tablets by mouth every morning.      mometasone (NASONEX) 50 MCG/ACT nasal spray Place 2 sprays into the nose daily.      Multiple Vitamins-Minerals (MULTIVITAMINS THER. W/MINERALS) TABS Take 1 tablet  by mouth at bedtime.      omega-3 acid ethyl esters (LOVAZA) 1 G capsule Take 1 g by mouth 2 (two) times daily.      pioglitazone (ACTOS) 30 MG tablet Take 30 mg by mouth at bedtime.      Tamsulosin HCl (FLOMAX) 0.4 MG CAPS Take 0.4 mg by mouth at bedtime.      Zinc Gluconate (COLD-EEZE) 13.3 MG LOZG Use as directed 1 lozenge in the mouth or throat daily as needed. For cold symptoms   OTC hold while in hospital    Zinc Gluconate-Vitamin C (ZINC-VITAMIN C LOZENGE MT) Use as directed 1 tablet in the mouth or throat daily. OTC hold while in hospital      STOP taking these medications     aspirin EC 81 MG tablet      diclofenac (VOLTAREN) 75 MG EC tablet      HYDROcodone-acetaminophen (VICODIN) 5-500 MG per tablet      ibuprofen (ADVIL,MOTRIN) 200 MG tablet         Diagnostic Studies: Dg Chest 2 View  08/10/2011  *RADIOLOGY REPORT*  Clinical Data: Preop for left knee surgery, history of chronic kidney disease and diabetes  CHEST - 2 VIEW  Comparison: X-ray of 12/19/2009  Findings: No active infiltrate or effusion is seen. Minimally prominent peribronchial thickening remains.  Mediastinal contours appear stable.  The heart is within normal limits in size.  No bony abnormality is noted.  IMPRESSION: No active lung disease.  Original Report Authenticated By: Juline Patch, M.D.    Disposition: Final discharge disposition not confirmed  Discharge Orders    Future Orders Please Complete By Expires   Diet - low sodium heart healthy      Call MD / Call 911      Comments:   If you experience chest pain or shortness of breath, CALL 911 and be transported to the hospital emergency room.  If you develope a fever above 101 F, pus (white drainage) or increased drainage or redness at the wound, or calf pain, call your surgeon's office.   Constipation Prevention      Comments:   Drink plenty of fluids.  Prune juice may be helpful.  You may use a stool softener, such as Colace (over the counter)  100 mg twice a day.  Use MiraLax (over the counter) for constipation as needed.   Increase activity slowly as tolerated      Weight Bearing  as taught in Physical Therapy      Comments:   Use a walker or crutches as instructed.   Patient may shower      Comments:   You may shower without a dressing once there is no drainage.  Do not wash over the wound.  If drainage remains, cover wound with plastic wrap and then shower.   Driving restrictions      Comments:   No driving for 2 weeks   CPM      Comments:   Continuous passive motion machine (CPM):      Use the CPM from 0 to 60 for 5 hours per day.      You may increase by 10 degrees per day.  You may break it up into 2 or 3 sessions per day.      Use CPM for 2 weeks or until you are told to stop.   Change dressing      Comments:   Change dressing on POD #5.  You may clean the incision with alcohol prior to redressing.   Do not put a pillow under the knee. Place it under the heel.         Follow-up Information    Follow up with [facility].   Contact information:   [facility] [address] [place] [address] [phone]           Signed: [name] 08/23/2011, 7:38 AM

## 2011-08-23 NOTE — Progress Notes (Signed)
ANTICOAGULATION CONSULT NOTE - Follow Up Consult  Pharmacy Consult for coumadin Indication: VTE prophylaxis  No Known Allergies  Patient Measurements: Height: 5\' 7"  (170.2 cm) Weight: 236 lb 5.3 oz (107.2 kg) IBW/kg (Calculated) : 66.1   Vital Signs: Temp: 97.6 F (36.4 C) (11/29 0654) Temp src: Oral (11/29 0654) BP: 111/68 mmHg (11/29 0654) Pulse Rate: 98  (11/29 0654)  Labs:  Basename 08/23/11 0630 08/22/11 0610 08/21/11 0635  HGB 10.3* 11.0* --  HCT 31.2* 33.2* 33.3*  PLT 145* 134* 144*  APTT -- -- --  LABPROT 26.6* 19.1* 14.4  INR 2.41* 1.57* 1.10  HEPARINUNFRC -- -- --  CREATININE -- -- 1.11  CKTOTAL -- -- --  CKMB -- -- --  TROPONINI -- -- --   Estimated Creatinine Clearance: 81.6 ml/min (by C-G formula based on Cr of 1.11).   Medications:  Scheduled:    . docusate sodium  100 mg Oral BID  . dutasteride  0.5 mg Oral QHS  . enoxaparin  30 mg Subcutaneous Q12H  . ezetimibe  10 mg Oral Daily  . fluticasone  1 spray Each Nare Daily  . insulin aspart  0-15 Units Subcutaneous TID WC  . lisinopril  20 mg Oral QHS  . multivitamins ther. w/minerals  1 tablet Oral QHS  . pioglitazone  30 mg Oral QHS  . pneumococcal 23 valent vaccine  0.5 mL Intramuscular Tomorrow-1000  . simvastatin  20 mg Oral Daily  . Tamsulosin HCl  0.4 mg Oral QHS  . warfarin  5 mg Oral ONCE-1800  . warfarin   Does not apply Once    Assessment: 60 yom started on coumadin for VTE prophylaxis s/p L TKA. INR goal is 1.5-2 per MD and INR is now supratherapeutic at 2.41 after a large jump from yesterday. No bleeding noted, CBC stable. MD DC instructions say 5mg  daily per INR results.   Goal of Therapy:  INR 1.5-2   Plan:  Hold coumadin today - RN instructions sent Resume coumadin 5mg  daily 11/30 as instructed by MD Recheck INR in 1-2 days  Finis Hendricksen, Drake Leach 08/23/2011,9:00 AM

## 2013-07-02 ENCOUNTER — Other Ambulatory Visit: Payer: Self-pay | Admitting: Orthopedic Surgery

## 2013-07-03 ENCOUNTER — Encounter (HOSPITAL_COMMUNITY): Payer: Self-pay | Admitting: Respiratory Therapy

## 2013-07-07 ENCOUNTER — Encounter (HOSPITAL_COMMUNITY): Payer: Self-pay

## 2013-07-07 ENCOUNTER — Encounter (HOSPITAL_COMMUNITY)
Admission: RE | Admit: 2013-07-07 | Discharge: 2013-07-07 | Disposition: A | Discharge status: Home / Self Care | Payer: BC Managed Care – PPO | Source: Ambulatory Visit | Attending: Orthopedic Surgery | Admitting: Orthopedic Surgery

## 2013-07-07 ENCOUNTER — Other Ambulatory Visit (HOSPITAL_COMMUNITY): Payer: Self-pay | Admitting: *Deleted

## 2013-07-07 DIAGNOSIS — Z01818 Encounter for other preprocedural examination: Secondary | ICD-10-CM | POA: Insufficient documentation

## 2013-07-07 DIAGNOSIS — Z01810 Encounter for preprocedural cardiovascular examination: Secondary | ICD-10-CM | POA: Insufficient documentation

## 2013-07-07 DIAGNOSIS — Z01812 Encounter for preprocedural laboratory examination: Secondary | ICD-10-CM | POA: Insufficient documentation

## 2013-07-07 HISTORY — DX: Mononeuropathy, unspecified: G58.9

## 2013-07-07 HISTORY — DX: Other allergy status, other than to drugs and biological substances: Z91.09

## 2013-07-07 HISTORY — DX: Injury of unspecified nerves of neck, initial encounter: S14.9XXA

## 2013-07-07 HISTORY — DX: Benign prostatic hyperplasia without lower urinary tract symptoms: N40.0

## 2013-07-07 LAB — CBC WITH DIFFERENTIAL/PLATELET
Basophils Absolute: 0 10*3/uL (ref 0.0–0.1)
Basophils Relative: 1 % (ref 0–1)
Eosinophils Absolute: 0.2 10*3/uL (ref 0.0–0.7)
Eosinophils Relative: 3 % (ref 0–5)
HCT: 43.3 % (ref 39.0–52.0)
Lymphs Abs: 1.9 10*3/uL (ref 0.7–4.0)
MCH: 27.9 pg (ref 26.0–34.0)
MCHC: 33.9 g/dL (ref 30.0–36.0)
MCV: 82.2 fL (ref 78.0–100.0)
Neutrophils Relative %: 62 % (ref 43–77)
Platelets: 177 10*3/uL (ref 150–400)
RBC: 5.27 MIL/uL (ref 4.22–5.81)
RDW: 13.9 % (ref 11.5–15.5)

## 2013-07-07 LAB — URINALYSIS, ROUTINE W REFLEX MICROSCOPIC
Bilirubin Urine: NEGATIVE
Glucose, UA: NEGATIVE mg/dL
Hgb urine dipstick: NEGATIVE
Nitrite: NEGATIVE
Specific Gravity, Urine: 1.024 (ref 1.005–1.030)
pH: 5 (ref 5.0–8.0)

## 2013-07-07 LAB — BASIC METABOLIC PANEL
CO2: 27 mEq/L (ref 19–32)
Calcium: 9.4 mg/dL (ref 8.4–10.5)
Chloride: 102 mEq/L (ref 96–112)
Creatinine, Ser: 1.14 mg/dL (ref 0.50–1.35)
GFR calc non Af Amer: 67 mL/min — ABNORMAL LOW (ref >90)
Glucose, Bld: 129 mg/dL — ABNORMAL HIGH (ref 70–99)

## 2013-07-07 LAB — TYPE AND SCREEN: Antibody Screen: NEGATIVE

## 2013-07-07 LAB — SURGICAL PCR SCREEN: Staphylococcus aureus: NEGATIVE

## 2013-07-07 LAB — PROTIME-INR: Prothrombin Time: 12.2 seconds (ref 11.6–15.2)

## 2013-07-07 MED ORDER — CHLORHEXIDINE GLUCONATE 4 % EX LIQD: 60.0000 mL | Freq: Once | CUTANEOUS | Status: DC

## 2013-07-07 NOTE — Pre-Procedure Instructions (Signed)
ANIK WESCH  07/07/2013   Your procedure is scheduled on:  Wednesday, July 15, 2013 at 12:45 PM.   Report to [facility]" at 10:45 AM.   Call this number if you have problems the morning of surgery: [phone]   Remember:   Do not eat food or drink liquids after midnight Tuesday, 07/13/13.   Take these medicines the morning of surgery with A SIP OF WATER: finasteride (PROSCAR), Tamsulosin HCl (FLOMAX), HYDROcodone-acetaminophen (NORCO/VICODIN) - if needed, methocarbamol (ROBAXIN) - if needed.   Stop all Vitamins, Herbal Medications, Fish Oil, Aspirin and NSAIDS (Mobic, Ibuprofen) as of today, 07/07/13.                 Do not wear jewelry.  Do not wear lotions, powders, or cologne. You may wear deodorant.  Do not shave 48 hours prior to surgery. Men may shave face and neck.  Do not bring valuables to the hospital.  [facility] is not responsible                  for any belongings or valuables.               Contacts, dentures or bridgework may not be worn into surgery.  Leave suitcase in the car. After surgery it may be brought to your room.  For patients admitted to the hospital, discharge time is determined by your                treatment team.    Special Instructions: Shower using CHG 2 nights before surgery and the night before surgery.  If you shower the day of surgery use CHG.  Use special wash - you have one bottle of CHG for all showers.  You should use approximately 1/3 of the bottle for each shower.   Please read over the following fact sheets that you were given: Pain Booklet, Coughing and Deep Breathing, Blood Transfusion Information, MRSA Information and Surgical Site Infection Prevention

## 2013-07-13 NOTE — H&P (Signed)
TOTAL KNEE ADMISSION H&P  Patient is being admitted for right total knee arthroplasty.  Subjective:  Chief Complaint:right knee pain.  HPI: Melvin Montoya, 62 y.o. male, has a history of pain and functional disability in the right knee due to arthritis and has failed non-surgical conservative treatments for greater than 12 weeks to includeNSAID's and/or analgesics, corticosteriod injections, flexibility and strengthening excercises and activity modification.  Onset of symptoms was gradual, starting several years ago with gradually worsening course since that time. The patient noted no past surgery on the right knee(s).  Patient currently rates pain in the right knee(s) at 10 out of 10 with activity. Patient has night pain, worsening of pain with activity and weight bearing, pain that interferes with activities of daily living, pain with passive range of motion and joint swelling.  Patient has evidence of joint space narrowing by imaging studies.  There is no active infection.  Patient Active Problem List   Diagnosis Date Noted  . Degenerative arthritis of left knee 08/20/2011   Past Medical History  Diagnosis Date  . Arthritis   . Chronic kidney disease     PT ON LISINOPRIL WATCHING FUNCT  . Diabetes mellitus   . Environmental allergies   . Enlarged prostate   . Pinched nerve in neck   . Headache(784.0)     thinks from pinched nerve in neck    Past Surgical History  Procedure Laterality Date  . Cholecystectomy  1980  . Wrist surgery  2005    LEFT  . Knee arthroscopy      BILAT  . Hernia repair  2010    UMBILICAL   . Total knee arthroplasty  08/20/2011    Procedure: TOTAL KNEE ARTHROPLASTY;  Surgeon: [name];  Location: [facility] OR;  Service: Orthopedics;  Laterality: Left;  . Appendectomy      thinks he had it taken out with his gallbladder  . Tonsillectomy    . Colonoscopy    . Vasectomy      No prescriptions prior to admission   No Known Allergies  History   Substance Use Topics  . Smoking status: Never Smoker   . Smokeless tobacco: Never Used  . Alcohol Use: Yes     Comment: very occasional    No family history on file.   Review of Systems  Constitutional: Negative.   HENT: Negative.   Eyes: Negative.   Respiratory: Negative.   Cardiovascular: Negative.   Gastrointestinal: Negative.   Genitourinary: Negative.   Musculoskeletal: Positive for joint pain.  Skin: Negative.   Neurological: Negative.   Endo/Heme/Allergies: Negative.   Psychiatric/Behavioral: Negative.     Objective:  Physical Exam  Constitutional: He is oriented to person, place, and time. He appears well-developed and well-nourished.  HENT:  Head: Normocephalic and atraumatic.  Eyes: Pupils are equal, round, and reactive to light.  Neck: Normal range of motion. Neck supple.  Cardiovascular: Intact distal pulses.   Respiratory: Effort normal.  Musculoskeletal: He exhibits tenderness.  Neurological: He is alert and oriented to person, place, and time. He has normal reflexes.  Skin: Skin is warm and dry.  Psychiatric: He has a normal mood and affect. His behavior is normal. Judgment and thought content normal.    Vital signs in last 24 hours:    Labs:   Estimated body mass index is 37.52 kg/(m^2) as calculated from the following:   Height as of 08/22/11: 5\' 7"  (1.702 m).   Weight as of 08/10/11: 108.7 kg (239  lb 10.2 oz).   Imaging Review Radiographs:  X-rays included; AP, Rosenberg, and lateral x-rays with sunrise view of the right knee shows end-stage arthritis the right knee with a varus deformity bone on bone with some erosion of the medial compartment 1-2 mm.  The right total knee appears to be well placed and well fixed on the AP and lateral x-rays with no evidence of loosening and a good bone cement interface.  Assessment/Plan:  End stage arthritis, right knee   The patient history, physical examination, clinical judgment of the provider and  imaging studies are consistent with end stage degenerative joint disease of the right knee(s) and total knee arthroplasty is deemed medically necessary. The treatment options including medical management, injection therapy arthroscopy and arthroplasty were discussed at length. The risks and benefits of total knee arthroplasty were presented and reviewed. The risks due to aseptic loosening, infection, stiffness, patella tracking problems, thromboembolic complications and other imponderables were discussed. The patient acknowledged the explanation, agreed to proceed with the plan and consent was signed. Patient is being admitted for inpatient treatment for surgery, pain control, PT, OT, prophylactic antibiotics, VTE prophylaxis, progressive ambulation and ADL's and discharge planning. The patient is planning to be discharged home with home health services

## 2013-07-14 MED ORDER — CEFAZOLIN SODIUM-DEXTROSE 2-3 GM-% IV SOLR
2.0000 g | INTRAVENOUS | Status: AC
  Administered 2013-07-15: 2 g via INTRAVENOUS
  Filled 2013-07-14 (×2): qty 50

## 2013-07-15 ENCOUNTER — Encounter (HOSPITAL_COMMUNITY)
Admission: RE | Disposition: A | Discharge status: Home Health | Payer: Self-pay | Source: Ambulatory Visit | Attending: Orthopedic Surgery

## 2013-07-15 ENCOUNTER — Ambulatory Visit (HOSPITAL_COMMUNITY): Payer: BC Managed Care – PPO | Admitting: Anesthesiology

## 2013-07-15 ENCOUNTER — Encounter (HOSPITAL_COMMUNITY): Payer: BC Managed Care – PPO | Admitting: Anesthesiology

## 2013-07-15 ENCOUNTER — Encounter (HOSPITAL_COMMUNITY): Payer: Self-pay | Admitting: *Deleted

## 2013-07-15 ENCOUNTER — Inpatient Hospital Stay (HOSPITAL_COMMUNITY)
Admission: RE | Admit: 2013-07-15 | Discharge: 2013-07-17 | DRG: 470 | Disposition: A | Discharge status: Home Health | Payer: BC Managed Care – PPO | Source: Ambulatory Visit | Attending: Orthopedic Surgery | Admitting: Orthopedic Surgery

## 2013-07-15 DIAGNOSIS — N189 Chronic kidney disease, unspecified: Secondary | ICD-10-CM | POA: Diagnosis present

## 2013-07-15 DIAGNOSIS — Z7982 Long term (current) use of aspirin: Secondary | ICD-10-CM

## 2013-07-15 DIAGNOSIS — Z79899 Other long term (current) drug therapy: Secondary | ICD-10-CM

## 2013-07-15 DIAGNOSIS — N400 Enlarged prostate without lower urinary tract symptoms: Secondary | ICD-10-CM | POA: Diagnosis present

## 2013-07-15 DIAGNOSIS — M1710 Unilateral primary osteoarthritis, unspecified knee: Principal | ICD-10-CM | POA: Diagnosis present

## 2013-07-15 DIAGNOSIS — Z23 Encounter for immunization: Secondary | ICD-10-CM

## 2013-07-15 DIAGNOSIS — Z9852 Vasectomy status: Secondary | ICD-10-CM

## 2013-07-15 DIAGNOSIS — E119 Type 2 diabetes mellitus without complications: Secondary | ICD-10-CM | POA: Diagnosis present

## 2013-07-15 DIAGNOSIS — M1711 Unilateral primary osteoarthritis, right knee: Secondary | ICD-10-CM | POA: Diagnosis present

## 2013-07-15 DIAGNOSIS — Z9089 Acquired absence of other organs: Secondary | ICD-10-CM

## 2013-07-15 HISTORY — PX: TOTAL KNEE ARTHROPLASTY: SHX125

## 2013-07-15 SURGERY — ARTHROPLASTY, KNEE, TOTAL
Anesthesia: Choice | Site: Knee | Laterality: Right | Wound class: Clean

## 2013-07-15 MED ORDER — SODIUM CHLORIDE 0.9 % IR SOLN
Status: DC | PRN
  Administered 2013-07-15: 3000 mL

## 2013-07-15 MED ORDER — PHENOL 1.4 % MT LIQD: 1.0000 | OROMUCOSAL | Status: DC | PRN

## 2013-07-15 MED ORDER — ALUM & MAG HYDROXIDE-SIMETH 200-200-20 MG/5ML PO SUSP: 30.0000 mL | ORAL | Status: DC | PRN

## 2013-07-15 MED ORDER — LISINOPRIL 20 MG PO TABS
20.0000 mg | ORAL_TABLET | Freq: Every day | ORAL | Status: DC
  Administered 2013-07-15 – 2013-07-16 (×2): 20 mg via ORAL
  Filled 2013-07-15 (×4): qty 1

## 2013-07-15 MED ORDER — BUPIVACAINE LIPOSOME 1.3 % IJ SUSP
20.0000 mL | Freq: Once | INTRAMUSCULAR | Status: DC
  Filled 2013-07-15: qty 20

## 2013-07-15 MED ORDER — BUPIVACAINE LIPOSOME 1.3 % IJ SUSP
INTRAMUSCULAR | Status: DC | PRN
  Administered 2013-07-15: 15:00:00

## 2013-07-15 MED ORDER — PROMETHAZINE HCL 25 MG/ML IJ SOLN: 6.2500 mg | INTRAMUSCULAR | Status: DC | PRN

## 2013-07-15 MED ORDER — ASPIRIN EC 325 MG PO TBEC: 325.0000 mg | DELAYED_RELEASE_TABLET | Freq: Two times a day (BID) | ORAL | Status: DC

## 2013-07-15 MED ORDER — HYDROMORPHONE HCL PF 1 MG/ML IJ SOLN
INTRAMUSCULAR | Status: AC
  Filled 2013-07-15: qty 1

## 2013-07-15 MED ORDER — ASPIRIN EC 325 MG PO TBEC
325.0000 mg | DELAYED_RELEASE_TABLET | Freq: Every day | ORAL | Status: DC
  Administered 2013-07-16 – 2013-07-17 (×2): 325 mg via ORAL
  Filled 2013-07-15 (×4): qty 1

## 2013-07-15 MED ORDER — LIDOCAINE HCL (CARDIAC) 20 MG/ML IV SOLN
INTRAVENOUS | Status: DC | PRN
  Administered 2013-07-15: 50 mg via INTRAVENOUS

## 2013-07-15 MED ORDER — OXYCODONE HCL 5 MG PO TABS: 5.0000 mg | ORAL_TABLET | Freq: Once | ORAL | Status: DC | PRN

## 2013-07-15 MED ORDER — ONDANSETRON HCL 4 MG/2ML IJ SOLN: 4.0000 mg | Freq: Four times a day (QID) | INTRAMUSCULAR | Status: DC | PRN

## 2013-07-15 MED ORDER — FENTANYL CITRATE 0.05 MG/ML IJ SOLN
INTRAMUSCULAR | Status: DC | PRN
  Administered 2013-07-15 (×2): 100 ug via INTRAVENOUS
  Administered 2013-07-15: 50 ug via INTRAVENOUS

## 2013-07-15 MED ORDER — MIDAZOLAM HCL 5 MG/5ML IJ SOLN
INTRAMUSCULAR | Status: DC | PRN
  Administered 2013-07-15: 2 mg via INTRAVENOUS

## 2013-07-15 MED ORDER — METOCLOPRAMIDE HCL 5 MG/ML IJ SOLN: 5.0000 mg | Freq: Three times a day (TID) | INTRAMUSCULAR | Status: DC | PRN

## 2013-07-15 MED ORDER — PHENYLEPHRINE HCL 10 MG/ML IJ SOLN
INTRAMUSCULAR | Status: DC | PRN
  Administered 2013-07-15 (×4): 80 ug via INTRAVENOUS

## 2013-07-15 MED ORDER — CEFUROXIME SODIUM 1.5 G IJ SOLR
INTRAMUSCULAR | Status: AC
  Filled 2013-07-15: qty 1.5

## 2013-07-15 MED ORDER — HYDROMORPHONE HCL PF 1 MG/ML IJ SOLN
0.2500 mg | INTRAMUSCULAR | Status: DC | PRN
  Administered 2013-07-15 (×2): 0.5 mg via INTRAVENOUS

## 2013-07-15 MED ORDER — DOCUSATE SODIUM 100 MG PO CAPS
100.0000 mg | ORAL_CAPSULE | Freq: Two times a day (BID) | ORAL | Status: DC
  Administered 2013-07-15 – 2013-07-17 (×4): 100 mg via ORAL
  Filled 2013-07-15 (×4): qty 1

## 2013-07-15 MED ORDER — OXYCODONE-ACETAMINOPHEN 5-325 MG PO TABS: 1.0000 | ORAL_TABLET | ORAL | Status: DC | PRN

## 2013-07-15 MED ORDER — SENNOSIDES-DOCUSATE SODIUM 8.6-50 MG PO TABS: 1.0000 | ORAL_TABLET | Freq: Every evening | ORAL | Status: DC | PRN

## 2013-07-15 MED ORDER — INFLUENZA VAC SPLIT QUAD 0.5 ML IM SUSP
0.5000 mL | INTRAMUSCULAR | Status: AC
  Administered 2013-07-16: 0.5 mL via INTRAMUSCULAR
  Filled 2013-07-15: qty 0.5

## 2013-07-15 MED ORDER — FLUTICASONE PROPIONATE 50 MCG/ACT NA SUSP
2.0000 | Freq: Every day | NASAL | Status: DC
  Administered 2013-07-15 – 2013-07-17 (×3): 2 via NASAL
  Filled 2013-07-15 (×2): qty 16

## 2013-07-15 MED ORDER — METOCLOPRAMIDE HCL 10 MG PO TABS: 5.0000 mg | ORAL_TABLET | Freq: Three times a day (TID) | ORAL | Status: DC | PRN

## 2013-07-15 MED ORDER — FINASTERIDE 5 MG PO TABS
5.0000 mg | ORAL_TABLET | Freq: Every evening | ORAL | Status: DC
  Administered 2013-07-15 – 2013-07-16 (×2): 5 mg via ORAL
  Filled 2013-07-15 (×4): qty 1

## 2013-07-15 MED ORDER — METHOCARBAMOL 500 MG PO TABS
500.0000 mg | ORAL_TABLET | Freq: Four times a day (QID) | ORAL | Status: DC | PRN
  Administered 2013-07-15 – 2013-07-17 (×6): 500 mg via ORAL
  Filled 2013-07-15 (×7): qty 1

## 2013-07-15 MED ORDER — OXYCODONE HCL 5 MG PO TABS
5.0000 mg | ORAL_TABLET | ORAL | Status: DC | PRN
  Administered 2013-07-15 – 2013-07-17 (×12): 10 mg via ORAL
  Filled 2013-07-15 (×12): qty 2

## 2013-07-15 MED ORDER — MIDAZOLAM HCL 2 MG/2ML IJ SOLN
0.5000 mg | Freq: Once | INTRAMUSCULAR | Status: DC | PRN
Start: 2013-07-15 — End: 2013-07-15

## 2013-07-15 MED ORDER — METHOCARBAMOL 100 MG/ML IJ SOLN
500.0000 mg | Freq: Four times a day (QID) | INTRAVENOUS | Status: DC | PRN
  Administered 2013-07-15: 500 mg via INTRAVENOUS
  Filled 2013-07-15: qty 5

## 2013-07-15 MED ORDER — CEFUROXIME SODIUM 1.5 G IJ SOLR
INTRAMUSCULAR | Status: DC | PRN
  Administered 2013-07-15: 1.5 g

## 2013-07-15 MED ORDER — MIDAZOLAM HCL 2 MG/2ML IJ SOLN
INTRAMUSCULAR | Status: AC
  Filled 2013-07-15: qty 2

## 2013-07-15 MED ORDER — DIPHENHYDRAMINE HCL 12.5 MG/5ML PO ELIX: 12.5000 mg | ORAL_SOLUTION | ORAL | Status: DC | PRN

## 2013-07-15 MED ORDER — OXYCODONE HCL 5 MG/5ML PO SOLN: 5.0000 mg | Freq: Once | ORAL | Status: DC | PRN

## 2013-07-15 MED ORDER — ACETAMINOPHEN 325 MG PO TABS: 650.0000 mg | ORAL_TABLET | Freq: Four times a day (QID) | ORAL | Status: DC | PRN

## 2013-07-15 MED ORDER — HYDROMORPHONE HCL PF 1 MG/ML IJ SOLN
INTRAMUSCULAR | Status: DC | PRN
  Administered 2013-07-15: 1 mg via INTRAVENOUS

## 2013-07-15 MED ORDER — PROPOFOL 10 MG/ML IV BOLUS
INTRAVENOUS | Status: DC | PRN
  Administered 2013-07-15: 200 mg via INTRAVENOUS

## 2013-07-15 MED ORDER — BISACODYL 5 MG PO TBEC: 5.0000 mg | DELAYED_RELEASE_TABLET | Freq: Every day | ORAL | Status: DC | PRN

## 2013-07-15 MED ORDER — FENTANYL CITRATE 0.05 MG/ML IJ SOLN
INTRAMUSCULAR | Status: AC
  Filled 2013-07-15: qty 2

## 2013-07-15 MED ORDER — DEXTROSE-NACL 5-0.45 % IV SOLN: INTRAVENOUS | Status: DC

## 2013-07-15 MED ORDER — MEPERIDINE HCL 25 MG/ML IJ SOLN: 6.2500 mg | INTRAMUSCULAR | Status: DC | PRN

## 2013-07-15 MED ORDER — MENTHOL 3 MG MT LOZG: 1.0000 | LOZENGE | OROMUCOSAL | Status: DC | PRN

## 2013-07-15 MED ORDER — LACTATED RINGERS IV SOLN
INTRAVENOUS | Status: DC | PRN
  Administered 2013-07-15 (×2): via INTRAVENOUS

## 2013-07-15 MED ORDER — MORPHINE SULFATE 10 MG/ML IJ SOLN
INTRAMUSCULAR | Status: DC | PRN
Start: 2013-07-15 — End: 2013-07-15
  Administered 2013-07-15: 5 mg via INTRAVENOUS

## 2013-07-15 MED ORDER — MAGNESIUM CITRATE PO SOLN
1.0000 | Freq: Once | ORAL | Status: AC | PRN
  Filled 2013-07-15 (×2): qty 296

## 2013-07-15 MED ORDER — ONDANSETRON HCL 4 MG PO TABS: 4.0000 mg | ORAL_TABLET | Freq: Four times a day (QID) | ORAL | Status: DC | PRN

## 2013-07-15 MED ORDER — KCL IN DEXTROSE-NACL 20-5-0.45 MEQ/L-%-% IV SOLN
INTRAVENOUS | Status: DC
  Administered 2013-07-15 – 2013-07-16 (×2): via INTRAVENOUS
  Filled 2013-07-15 (×9): qty 1000

## 2013-07-15 MED ORDER — HYDROMORPHONE HCL PF 1 MG/ML IJ SOLN
1.0000 mg | INTRAMUSCULAR | Status: DC | PRN
  Administered 2013-07-16: 1 mg via INTRAVENOUS
  Filled 2013-07-15: qty 1

## 2013-07-15 MED ORDER — ACETAMINOPHEN 650 MG RE SUPP: 650.0000 mg | Freq: Four times a day (QID) | RECTAL | Status: DC | PRN

## 2013-07-15 MED ORDER — SODIUM CHLORIDE 0.9 % IV SOLN
INTRAVENOUS | Status: DC
  Administered 2013-07-15 (×2): via INTRAVENOUS

## 2013-07-15 MED ORDER — INSULIN ASPART 100 UNIT/ML ~~LOC~~ SOLN
0.0000 [IU] | Freq: Three times a day (TID) | SUBCUTANEOUS | Status: DC
  Administered 2013-07-16 (×2): 3 [IU] via SUBCUTANEOUS
  Administered 2013-07-17 (×2): 2 [IU] via SUBCUTANEOUS

## 2013-07-15 MED ORDER — METHOCARBAMOL 500 MG PO TABS: 500.0000 mg | ORAL_TABLET | Freq: Two times a day (BID) | ORAL | Status: DC

## 2013-07-15 SURGICAL SUPPLY — 58 items
BANDAGE ELASTIC 6 VELCRO ST LF (GAUZE/BANDAGES/DRESSINGS) ×2 IMPLANT
BANDAGE ESMARK 6X9 LF (GAUZE/BANDAGES/DRESSINGS) ×1 IMPLANT
BLADE SAG 18X100X1.27 (BLADE) ×2 IMPLANT
BLADE SAW SGTL 13X75X1.27 (BLADE) ×2 IMPLANT
BLADE SURG ROTATE 9660 (MISCELLANEOUS) IMPLANT
BNDG ELASTIC 6X10 VLCR STRL LF (GAUZE/BANDAGES/DRESSINGS) ×2 IMPLANT
BNDG ESMARK 6X9 LF (GAUZE/BANDAGES/DRESSINGS) ×2
BOWL SMART MIX CTS (DISPOSABLE) ×2 IMPLANT
CAPT RP KNEE ×2 IMPLANT
CEMENT HV SMART SET (Cement) ×4 IMPLANT
CLOTH BEACON ORANGE TIMEOUT ST (SAFETY) ×2 IMPLANT
COVER SURGICAL LIGHT HANDLE (MISCELLANEOUS) ×2 IMPLANT
CUFF TOURNIQUET SINGLE 34IN LL (TOURNIQUET CUFF) IMPLANT
CUFF TOURNIQUET SINGLE 44IN (TOURNIQUET CUFF) IMPLANT
DRAPE EXTREMITY T 121X128X90 (DRAPE) ×2 IMPLANT
DRAPE U-SHAPE 47X51 STRL (DRAPES) ×2 IMPLANT
DRSG PAD ABDOMINAL 8X10 ST (GAUZE/BANDAGES/DRESSINGS) ×2 IMPLANT
DURAPREP 26ML APPLICATOR (WOUND CARE) ×2 IMPLANT
ELECT REM PT RETURN 9FT ADLT (ELECTROSURGICAL) ×2
ELECTRODE REM PT RTRN 9FT ADLT (ELECTROSURGICAL) ×1 IMPLANT
EVACUATOR 1/8 PVC DRAIN (DRAIN) ×2 IMPLANT
GAUZE XEROFORM 1X8 LF (GAUZE/BANDAGES/DRESSINGS) ×2 IMPLANT
GLOVE BIO SURGEON STRL SZ7.5 (GLOVE) ×2 IMPLANT
GLOVE BIO SURGEON STRL SZ8.5 (GLOVE) ×4 IMPLANT
GLOVE BIOGEL PI IND STRL 8 (GLOVE) ×2 IMPLANT
GLOVE BIOGEL PI IND STRL 9 (GLOVE) ×1 IMPLANT
GLOVE BIOGEL PI INDICATOR 8 (GLOVE) ×2
GLOVE BIOGEL PI INDICATOR 9 (GLOVE) ×1
GOWN PREVENTION PLUS XLARGE (GOWN DISPOSABLE) ×2 IMPLANT
GOWN STRL NON-REIN LRG LVL3 (GOWN DISPOSABLE) ×2 IMPLANT
GOWN STRL REIN XL XLG (GOWN DISPOSABLE) ×4 IMPLANT
HANDPIECE INTERPULSE COAX TIP (DISPOSABLE) ×1
HOOD PEEL AWAY FACE SHEILD DIS (HOOD) ×6 IMPLANT
KIT BASIN OR (CUSTOM PROCEDURE TRAY) ×2 IMPLANT
KIT ROOM TURNOVER OR (KITS) ×2 IMPLANT
MANIFOLD NEPTUNE II (INSTRUMENTS) ×2 IMPLANT
NDL SAFETY ECLIPSE 18X1.5 (NEEDLE) IMPLANT
NEEDLE HYPO 18GX1.5 SHARP (NEEDLE)
NEEDLE SPNL 18GX3.5 QUINCKE PK (NEEDLE) IMPLANT
NS IRRIG 1000ML POUR BTL (IV SOLUTION) ×2 IMPLANT
PACK TOTAL JOINT (CUSTOM PROCEDURE TRAY) ×2 IMPLANT
PAD ARMBOARD 7.5X6 YLW CONV (MISCELLANEOUS) ×4 IMPLANT
PADDING CAST COTTON 6X4 STRL (CAST SUPPLIES) ×2 IMPLANT
SET HNDPC FAN SPRY TIP SCT (DISPOSABLE) ×1 IMPLANT
SPONGE GAUZE 4X4 12PLY (GAUZE/BANDAGES/DRESSINGS) ×2 IMPLANT
STAPLER VISISTAT 35W (STAPLE) ×2 IMPLANT
SUCTION FRAZIER TIP 10 FR DISP (SUCTIONS) ×2 IMPLANT
SUT VIC AB 0 CTX 36 (SUTURE) ×1
SUT VIC AB 0 CTX36XBRD ANTBCTR (SUTURE) ×1 IMPLANT
SUT VIC AB 1 CTX 36 (SUTURE) ×1
SUT VIC AB 1 CTX36XBRD ANBCTR (SUTURE) ×1 IMPLANT
SUT VIC AB 2-0 CT1 27 (SUTURE) ×1
SUT VIC AB 2-0 CT1 TAPERPNT 27 (SUTURE) ×1 IMPLANT
SYR 50ML LL SCALE MARK (SYRINGE) ×2 IMPLANT
TOWEL OR 17X24 6PK STRL BLUE (TOWEL DISPOSABLE) ×2 IMPLANT
TOWEL OR 17X26 10 PK STRL BLUE (TOWEL DISPOSABLE) ×2 IMPLANT
TRAY FOLEY CATH 14FR (SET/KITS/TRAYS/PACK) IMPLANT
WATER STERILE IRR 1000ML POUR (IV SOLUTION) ×4 IMPLANT

## 2013-07-15 NOTE — Progress Notes (Signed)
Orthopedic Tech Progress Note Patient Details:  Melvin Montoya [birth year] [id number]  CPM Right Knee CPM Right Knee: On Right Knee Flexion (Degrees): 60 Right Knee Extension (Degrees): 0   Cammer, Mickie Bail 07/15/2013, 5:19 PM

## 2013-07-15 NOTE — Preoperative (Signed)
Beta Blockers   Reason not to administer Beta Blockers:Not Applicable 

## 2013-07-15 NOTE — Op Note (Signed)
PATIENT ID:      Melvin Montoya  MRN:     [id number] DOB/AGE:    63-Oct-1952 / 63 y.o.       OPERATIVE REPORT    DATE OF PROCEDURE:  07/15/2013       PREOPERATIVE DIAGNOSIS:   OSTEOARTHRITIS RIGHT KNEE      Estimated body mass index is 37.52 kg/(m^2) as calculated from the following:   Height as of 07/07/13: 5\' 7"  (1.702 m).   Weight as of 08/10/11: 108.7 kg (239 lb 10.2 oz).                                                        POSTOPERATIVE DIAGNOSIS:   OSTEOARTHRITIS RIGHT KNEE                                                                      PROCEDURE:  Procedure(s): TOTAL KNEE ARTHROPLASTY Using Depuy Sigma RP implants #4R Femur, #5Tibia, 10mm sigma RP bearing, 41 Patella     SURGEON: Aveyah Greenwood J    ASSISTANT:   Eric K. [employer]   (Present and scrubbed throughout the case, critical for assistance with exposure, retraction, instrumentation, and closure.)         ANESTHESIA: GET Exparel  DRAINS: foley, 2 medium hemovac in knee   TOURNIQUET TIME:   COMPLICATIONS:  None     SPECIMENS: None   INDICATIONS FOR PROCEDURE: The patient has  OSTEOARTHRITIS RIGHT KNEE, varus deformities, XR shows bone on bone arthritis. Patient has failed all conservative measures including anti-inflammatory medicines, narcotics, attempts at  exercise and weight loss, cortisone injections and viscosupplementation.  Risks and benefits of surgery have been discussed, questions answered.   DESCRIPTION OF PROCEDURE: The patient identified by armband, received  IV antibiotics, in the holding area at [facility] Patient taken to the operating room, appropriate anesthetic  monitors were attached, and general endotracheal anesthesia induced with  the patient in supine position, Foley catheter was inserted. Tourniquet  applied high to the operative thigh. Lateral post and foot positioner  applied to the table, the lower extremity was then prepped and draped  in usual sterile fashion from  the ankle to the tourniquet. Time-out procedure was performed. The limb was wrapped with an Esmarch bandage and the tourniquet inflated to 350 mmHg. We began the operation by making the anterior midline incision starting at handbreadth above the patella going over the patella 1 cm medial to and  4 cm distal to the tibial tubercle. Small bleeders in the skin and the  subcutaneous tissue identified and cauterized. Transverse retinaculum was incised and reflected medially and a medial parapatellar arthrotomy was accomplished. the patella was everted and theprepatellar fat pad resected. The superficial medial collateral  ligament was then elevated from anterior to posterior along the proximal  flare of the tibia and anterior half of the menisci resected. The knee was hyperflexed exposing bone on bone arthritis. Peripheral and notch osteophytes as well as the cruciate ligaments were then resected. We continued to  work our way around posteriorly  along the proximal tibia, and externally  rotated the tibia subluxing it out from underneath the femur. A McHale  retractor was placed through the notch and a lateral Hohmann retractor  placed, and we then drilled through the proximal tibia in line with the  axis of the tibia followed by an intramedullary guide rod and 2-degree  posterior slope cutting guide. The tibial cutting guide was pinned into place  allowing resection of 4 mm of bone medially and about 10 mm of bone  laterally because of her varus deformity. Satisfied with the tibial resection, we then  entered the distal femur 2 mm anterior to the PCL origin with the  intramedullary guide rod and applied the distal femoral cutting guide  set at 11mm, with 5 degrees of valgus. This was pinned along the  epicondylar axis. At this point, the distal femoral cut was accomplished without difficulty. We then sized for a #4R femoral component and pinned the guide in 3 degrees of external rotation.The chamfer  cutting guide was pinned into place. The anterior, posterior, and chamfer cuts were accomplished without difficulty followed by  the Sigma RP box cutting guide and the box cut. We also removed posterior osteophytes from the posterior femoral condyles. At this  time, the knee was brought into full extension. We checked our  extension and flexion gaps and found them symmetric at 10mm.  The patella thickness measured at 26 mm. We set the cutting guide at 15 and removed the posterior 11 mm  of the patella, sized for a 41 button and drilled the lollipop. The knee  was then once again hyperflexed exposing the proximal tibia. We sized for a #5 tibial base plate, applied the smokestack and the conical reamer followed by the the Delta fin keel punch. We then hammered into place the Sigma RP trial femoral component, inserted a 10-mm trial bearing, trial patellar button, and took the knee through range of motion from 0-130 degrees. No thumb pressure was required for patellar  tracking. At this point, all trial components were removed, a double batch of DePuy HV cement with 1500 mg of Zinacef was mixed and applied to all bony metallic mating surfaces except for the posterior condyles of the femur itself. In order, we  hammered into place the tibial tray and removed excess cement, the femoral component and removed excess cement, a 10-mm Sigma RP bearing  was inserted, and the knee brought to full extension with compression.  The patellar button was clamped into place, and excess cement  removed. While the cement cured the wound was irrigated out with normal saline solution pulse lavage, and medium Hemovac drains were placed from an anterolateral  approach. Ligament stability and patellar tracking were checked and found to be excellent. The parapatellar arthrotomy was closed with  running #1 Vicryl suture. The subcutaneous tissue with 0 and 2-0 undyed  Vicryl suture, and the skin with skin staples. A dressing of  Xeroform,  4 x 4, dressing sponges, Webril, and Ace wrap applied. The patient  awakened, extubated, and taken to recovery room without difficulty.   Gean Birchwood J 07/15/2013, 2:53 PM

## 2013-07-15 NOTE — Anesthesia Postprocedure Evaluation (Signed)
  Anesthesia Post-op Note  Patient: Melvin Montoya  Procedure(s) Performed: Procedure(s): TOTAL KNEE ARTHROPLASTY (Right)  Patient Location: PACU  Anesthesia Type:GA combined with regional for post-op pain  Level of Consciousness: awake, alert , oriented and patient cooperative  Airway and Oxygen Therapy: Patient Spontanous Breathing and Patient connected to nasal cannula oxygen  Post-op Pain: mild  Post-op Assessment: Post-op Vital signs reviewed, Patient's Cardiovascular Status Stable, Respiratory Function Stable, Patent Airway, No signs of Nausea or vomiting and Pain level controlled  Post-op Vital Signs: Reviewed and stable  Complications: No apparent anesthesia complications

## 2013-07-15 NOTE — Transfer of Care (Signed)
Immediate Anesthesia Transfer of Care Note  Patient: Melvin Montoya  Procedure(s) Performed: Procedure(s): TOTAL KNEE ARTHROPLASTY (Right)  Patient Location: PACU  Anesthesia Type:General  Level of Consciousness: awake, sedated, patient cooperative and confused  Airway & Oxygen Therapy: Patient Spontanous Breathing and Patient connected to face mask oxygen  Post-op Assessment: Report given to PACU RN, Post -op Vital signs reviewed and stable, Patient moving all extremities and Patient moving all extremities X 4  Post vital signs: Reviewed and stable  Complications: No apparent anesthesia complications

## 2013-07-15 NOTE — Anesthesia Procedure Notes (Addendum)
Procedure Name: LMA Insertion Date/Time: 07/15/2013 1:29 PM Performed by: Marena Chancy Pre-anesthesia Checklist: Patient identified, Patient being monitored, Emergency Drugs available, Timeout performed and Suction available Patient Re-evaluated:Patient Re-evaluated prior to inductionOxygen Delivery Method: Circle system utilized Preoxygenation: Pre-oxygenation with 100% oxygen Intubation Type: IV induction LMA: LMA inserted LMA Size: 5.0 Number of attempts: 1 Placement Confirmation: breath sounds checked- equal and bilateral and positive ETCO2 Tube secured with: Tape Dental Injury: Teeth and Oropharynx as per pre-operative assessment     Anesthesia Regional Block:  Adductor canal block  Pre-Anesthetic Checklist: ,, timeout performed, Correct Patient, Correct Site, Correct Laterality, Correct Procedure, Correct Position, site marked, Risks and benefits discussed,  Surgical consent,  Pre-op evaluation,  At surgeon's request and post-op pain management  Laterality: Right  Prep: chloraprep       Needles:  Injection technique: Single-shot  Needle Type: Echogenic Stimulator Needle     Needle Length:cm 9 cm Needle Gauge: 22 and 22 G    Additional Needles:  Procedures: ultrasound guided (picture in chart) Adductor canal block Narrative:  Start time: 07/15/2013 1:10 PM End time: 07/15/2013 1:15 PM Injection made incrementally with aspirations every 5 mL.  Performed by: Personally   Additional Notes: 25 cc 0.5% Marcaine with 1:200 Epi and 4 mg decadron injected easily  Adductor canal block

## 2013-07-15 NOTE — Anesthesia Preprocedure Evaluation (Signed)
Anesthesia Evaluation  Patient identified by MRN, date of birth, ID band Patient awake    Reviewed: Allergy & Precautions  History of Anesthesia Complications Negative for: history of anesthetic complications  Airway       Dental   Pulmonary neg pulmonary ROS,          Cardiovascular negative cardio ROS      Neuro/Psych  Headaches, negative psych ROS   GI/Hepatic negative GI ROS, Neg liver ROS,   Endo/Other  diabetes, Well Controlled, Type 2  Renal/GU Renal diseaseObservation for CKD because of Lisinopril     Musculoskeletal negative musculoskeletal ROS (+)   Abdominal   Peds  Hematology negative hematology ROS (+)   Anesthesia Other Findings   Reproductive/Obstetrics                           Anesthesia Physical Anesthesia Plan  ASA: II  Anesthesia Plan: General and Regional   Post-op Pain Management: MAC Combined w/ Regional for Post-op pain   Induction: Intravenous  Airway Management Planned: LMA  Additional Equipment:   Intra-op Plan:   Post-operative Plan:   Informed Consent:   Plan Discussed with: Anesthesiologist and CRNA  Anesthesia Plan Comments:         Anesthesia Quick Evaluation

## 2013-07-15 NOTE — Plan of Care (Signed)
Problem: Consults Goal: Diagnosis- Total Joint Replacement Primary Total Knee Right     

## 2013-07-15 NOTE — Interval H&P Note (Signed)
History and Physical Interval Note:  07/15/2013 12:39 PM  Melvin Montoya  has presented today for surgery, with the diagnosis of OSTEOARTHRITIS RIGHT KNEE  The various methods of treatment have been discussed with the patient and family. After consideration of risks, benefits and other options for treatment, the patient has consented to  Procedure(s): TOTAL KNEE ARTHROPLASTY (Right) as a surgical intervention .  The patient's history has been reviewed, patient examined, no change in status, stable for surgery.  I have reviewed the patient's chart and labs.  Questions were answered to the patient's satisfaction.     [name]

## 2013-07-16 ENCOUNTER — Encounter (HOSPITAL_COMMUNITY): Payer: Self-pay | Admitting: Orthopedic Surgery

## 2013-07-16 LAB — GLUCOSE, CAPILLARY
Glucose-Capillary: 169 mg/dL — ABNORMAL HIGH (ref 70–99)
Glucose-Capillary: 116 mg/dL — ABNORMAL HIGH (ref 70–99)
Glucose-Capillary: 156 mg/dL — ABNORMAL HIGH (ref 70–99)

## 2013-07-16 LAB — CBC
HCT: 38.5 % — ABNORMAL LOW (ref 39.0–52.0)
Hemoglobin: 12.8 g/dL — ABNORMAL LOW (ref 13.0–17.0)
MCH: 27.6 pg (ref 26.0–34.0)
MCHC: 33.2 g/dL (ref 30.0–36.0)
Platelets: 203 10*3/uL (ref 150–400)
RBC: 4.64 MIL/uL (ref 4.22–5.81)

## 2013-07-16 NOTE — Evaluation (Signed)
Physical Therapy Evaluation Patient Details Name: Melvin Montoya MRN: [id number] DOB: [birth year] Today's Date: 07/16/2013 Time: 0940-1005 PT Time Calculation (min): 25 min  PT Assessment / Plan / Recommendation History of Present Illness  Pt is a 62 y/o male admitted s/p R TKA.  Clinical Impression  Pt presents with acute pain and decreased independence with functional activity. At the time of PT eval, pt was able to perform transfers with min to mod assist and ambulation with min guard. This patient is appropriate for skilled PT interventions to address functional limitations, improve safety and independence with functional mobility, and return to PLOF.    PT Assessment  Patient needs continued PT services    Follow Up Recommendations  Home health PT    Does the patient have the potential to tolerate intense rehabilitation      Barriers to Discharge        Equipment Recommendations  3in1 (PT)    Recommendations for Other Services     Frequency 7X/week    Precautions / Restrictions Precautions Precautions: Fall;Knee Restrictions Weight Bearing Restrictions: Yes RLE Weight Bearing: Weight bearing as tolerated   Pertinent Vitals/Pain 7/10 pain after ambulation. Pt reports notifying nursing of pain prior to session beginning.      Mobility  Bed Mobility Bed Mobility: Supine to Sit;Sitting - Scoot to Edge of Bed Supine to Sit: 3: Mod assist;HOB flat;HOB elevated Sitting - Scoot to Edge of Bed: 4: Min guard Details for Bed Mobility Assistance: VC's for sequencing and safety awareness with transfer to EOB. Chair pulled up for pt to lean against while using urinal. Transfers Transfers: Sit to Stand;Stand to Sit Sit to Stand: 4: Min assist;From bed;With upper extremity assist Stand to Sit: 4: Min guard;To chair/3-in-1;With upper extremity assist Details for Transfer Assistance: VC's for hand placement on seated surface and RLE placement prior to initiating  transfers. Ambulation/Gait Ambulation/Gait Assistance: 4: Min guard Ambulation Distance (Feet): 6 Feet Assistive device: Rolling walker Ambulation/Gait Assistance Details: VC's for sequencing with the RW and WB status. Gait Pattern: Step-to pattern;Decreased stride length;Narrow base of support Gait velocity: Decreased    Exercises Total Joint Exercises Ankle Circles/Pumps: 10 reps Quad Sets: 10 reps Heel Slides: 10 reps;Seated Goniometric ROM: 5-65   PT Diagnosis: Difficulty walking;Acute pain  PT Problem List: Decreased strength;Decreased range of motion;Decreased balance;Decreased activity tolerance;Decreased mobility;Decreased knowledge of use of DME;Decreased safety awareness;Decreased knowledge of precautions;Pain PT Treatment Interventions: DME instruction;Gait training;Stair training;Functional mobility training;Therapeutic activities;Therapeutic exercise;Neuromuscular re-education;Patient/family education     PT Goals(Current goals can be found in the care plan section) Acute Rehab PT Goals Patient Stated Goal: To return home PT Goal Formulation: With patient Time For Goal Achievement: 07/23/13 Potential to Achieve Goals: Good  Visit Information  Last PT Received On: 07/16/13 Assistance Needed: +1 History of Present Illness: Pt is a 62 y/o male admitted s/p R TKA.       Prior Functioning  Home Living Family/patient expects to be discharged to:: Private residence Living Arrangements: Spouse/significant other Available Help at Discharge: Family;Available 24 hours/day Type of Home: House Home Access: Stairs to enter [employer] of Steps: 2 Entrance Stairs-Rails: None Home Layout: One level Home Equipment: Walker - 2 wheels;Cane - single point;Toilet riser Prior Function Level of Independence: Independent Communication Communication: No difficulties Dominant Hand: Right    Cognition  Cognition Arousal/Alertness: Awake/alert Behavior During  Therapy: WFL for tasks assessed/performed Overall Cognitive Status: Within Functional Limits for tasks assessed    Extremity/Trunk Assessment Upper Extremity Assessment Upper  Extremity Assessment: Overall WFL for tasks assessed Lower Extremity Assessment Lower Extremity Assessment: RLE deficits/detail RLE: Unable to fully assess due to pain Cervical / Trunk Assessment Cervical / Trunk Assessment: Normal   Balance Balance Balance Assessed: Yes Static Sitting Balance Static Sitting - Balance Support: Feet supported;Bilateral upper extremity supported Static Sitting - Level of Assistance: 5: Stand by assistance Static Sitting - Comment/# of Minutes: 5 Static Standing Balance Static Standing - Balance Support: Left upper extremity supported Static Standing - Level of Assistance: 4: Min assist Static Standing - Comment/# of Minutes: During urinal use.  End of Session PT - End of Session Equipment Utilized During Treatment: Gait belt Activity Tolerance: Patient tolerated treatment well Patient left: in chair;with call bell/phone within reach Nurse Communication: Mobility status CPM Right Knee CPM Right Knee: Off  GP     Ruthann Cancer 07/16/2013, 12:21 PM  Ruthann Cancer, PT, DPT Acute Rehabilitation Services [phone]

## 2013-07-16 NOTE — Progress Notes (Signed)
07/16/13 Set up with HHPT, HHOT and HHRN with [facility] by MD office. Spoke with patient, no change in d/c plan. [employer] providing CPM, patient has rolling walker and 3N1. Jacquelynn Cree RN, BSN, CCM

## 2013-07-16 NOTE — Progress Notes (Signed)
Physical Therapy Treatment Patient Details Name: Melvin Montoya MRN: [id number] DOB: 12/06/1950 Today's Date: 07/16/2013 Time: 4098-1191 PT Time Calculation (min): 28 min  PT Assessment / Plan / Recommendation  History of Present Illness Pt is a 62 y/o male admitted s/p R TKA.   PT Comments   Pt reports pain is 13/10 at beginning of PT session. Pt states he has been in communication with nursing regarding when he can have pain medication again, and agrees to bed exercise. Pt able to participate well despite high pain level, but required active assist for all ther ex and increased time to achieve x10 rep goal for each. At end of session, pt reports his pain is better, but still high.   Follow Up Recommendations  Home health PT     Does the patient have the potential to tolerate intense rehabilitation     Barriers to Discharge        Equipment Recommendations  3in1 (PT)    Recommendations for Other Services    Frequency 7X/week   Progress towards PT Goals Progress towards PT goals: Progressing toward goals  Plan Current plan remains appropriate    Precautions / Restrictions Precautions Precautions: Fall;Knee Restrictions Weight Bearing Restrictions: Yes RLE Weight Bearing: Weight bearing as tolerated   Pertinent Vitals/Pain See above regarding pain.    Mobility  Bed Mobility Bed Mobility: Not assessed Supine to Sit: 3: Mod assist;HOB flat;HOB elevated Sitting - Scoot to Edge of Bed: 4: Min guard Details for Bed Mobility Assistance: Due to pain Transfers Transfers: Not assessed Sit to Stand: 4: Min assist;From bed;With upper extremity assist Stand to Sit: 4: Min guard;To chair/3-in-1;With upper extremity assist Details for Transfer Assistance: Due to pain Ambulation/Gait Ambulation/Gait Assistance: 4: Min guard Ambulation Distance (Feet): 6 Feet Assistive device: Rolling walker Ambulation/Gait Assistance Details: VC's for sequencing with the RW and WB status. Gait  Pattern: Step-to pattern;Decreased stride length;Narrow base of support Gait velocity: Decreased    Exercises Total Joint Exercises Ankle Circles/Pumps: 10 reps Quad Sets: 10 reps Towel Squeeze: 10 reps Short Arc Quad: 10 reps Heel Slides: 10 reps Hip ABduction/ADduction: 10 reps Straight Leg Raises: 10 reps Goniometric ROM: 5-65   PT Diagnosis: Difficulty walking;Acute pain  PT Problem List: Decreased strength;Decreased range of motion;Decreased balance;Decreased activity tolerance;Decreased mobility;Decreased knowledge of use of DME;Decreased safety awareness;Decreased knowledge of precautions;Pain PT Treatment Interventions: DME instruction;Gait training;Stair training;Functional mobility training;Therapeutic activities;Therapeutic exercise;Neuromuscular re-education;Patient/family education   PT Goals (current goals can now be found in the care plan section) Acute Rehab PT Goals Patient Stated Goal: To return home PT Goal Formulation: With patient Time For Goal Achievement: 07/23/13 Potential to Achieve Goals: Good  Visit Information  Last PT Received On: 07/16/13 Assistance Needed: +1 History of Present Illness: Pt is a 62 y/o male admitted s/p R TKA.    Subjective Data  Subjective: "My pain is a 13/10." Patient Stated Goal: To return home   Cognition  Cognition Arousal/Alertness: Awake/alert Behavior During Therapy: WFL for tasks assessed/performed Overall Cognitive Status: Within Functional Limits for tasks assessed    Balance  Balance Balance Assessed: Yes Static Sitting Balance Static Sitting - Balance Support: Feet supported;Bilateral upper extremity supported Static Sitting - Level of Assistance: 5: Stand by assistance Static Sitting - Comment/# of Minutes: 5 Static Standing Balance Static Standing - Balance Support: Left upper extremity supported Static Standing - Level of Assistance: 4: Min assist Static Standing - Comment/# of Minutes: During urinal  use.  End of Session PT -  End of Session Equipment Utilized During Treatment: Gait belt Activity Tolerance: Patient limited by pain Patient left: in bed;with call bell/phone within reach Nurse Communication: Mobility status CPM Right Knee CPM Right Knee: Off   GP     Ruthann Cancer 07/16/2013, 2:54 PM  Ruthann Cancer, PT, DPT Acute Rehabilitation Services [phone]

## 2013-07-16 NOTE — Progress Notes (Signed)
Utilization Review Completed.Smith Mcnicholas T10/23/2014  

## 2013-07-16 NOTE — Progress Notes (Signed)
OT Cancellation Note  Patient Details Name: ZAYD BONET MRN: [id number] DOB: 12-19-1950   Cancelled Treatment:    Reason Eval/Treat Not Completed: Other (comment). Pt in a lot of pain and not quite due to for pain meds, just finishing up with PT for exercises. Will see pt tomorrow.  Evette Georges [phone] 07/16/2013, 2:37 PM

## 2013-07-16 NOTE — Progress Notes (Signed)
Orthopedic Tech Progress Note Patient Details:  Melvin Montoya Dec 12, 1950 [id number] On cpm at 7:35 pm RLE 0-35 Patient ID: Melvin Montoya, male   DOB: 02-Sep-1951, 62 y.o.   MRN: [id number]   [name] 07/16/2013, 8:12 PM

## 2013-07-16 NOTE — Addendum Note (Signed)
Addendum created 07/16/13 0913 by Kipp Brood, MD   Modules edited: Anesthesia Blocks and Procedures

## 2013-07-16 NOTE — Progress Notes (Signed)
Patient ID: Melvin Montoya, male   DOB: 1951-09-16, 61 y.o.   MRN: [id number] PATIENT ID: Melvin Montoya  MRN: [id number]  DOB/AGE:  08/28/1951 / 63 y.o.  1 Day Post-Op Procedure(s) (LRB): TOTAL KNEE ARTHROPLASTY (Right)    PROGRESS NOTE Subjective: Patient is alert, oriented, no Nausea, no Vomiting, yes passing gas, no Bowel Movement. Taking PO well. Denies SOB, Chest or Calf Pain. Using Incentive Spirometer, PAS in place. Ambulate wbat today, CPM 0-60 Patient reports pain as 2 on 0-10 scale  .    Objective: Vital signs in last 24 hours: Filed Vitals:   07/15/13 1732 07/15/13 1750 07/15/13 2022 07/16/13 0505  BP:  144/83 139/77 102/58  Pulse:  82 88 85  Temp: 98.3 F (36.8 C) 97.9 F (36.6 C) 98.2 F (36.8 C) 99.1 F (37.3 C)  TempSrc:   Oral Oral  Resp:  14 16 18   SpO2:  98% 96% 97%      Intake/Output from previous day: I/O last 3 completed shifts: In: 2835.4 [I.V.:2835.4] Out: 1900 [Urine:1500; Drains:400]   Intake/Output this shift:     LABORATORY DATA:  Recent Labs  07/15/13 1619 07/15/13 2152 07/16/13 0500 07/16/13 0639  WBC  --   --  12.9*  --   HGB  --   --  12.8*  --   HCT  --   --  38.5*  --   PLT  --   --  203  --   GLUCAP 144* 260*  --  169*    Examination: Neurologically intact ABD soft Neurovascular intact Sensation intact distally Intact pulses distally Dorsiflexion/Plantar flexion intact Incision: no drainage No cellulitis present Compartment soft} Blood and plasma separated in drain indicating minimal recent drainage, drain pulled without difficulty.  Assessment:   1 Day Post-Op Procedure(s) (LRB): TOTAL KNEE ARTHROPLASTY (Right) ADDITIONAL DIAGNOSIS:    Plan: PT/OT WBAT, CPM 5/hrs day until ROM 0-90 degrees, then D/C CPM DVT Prophylaxis:  SCDx72hrs, ASA 325 mg BID x 2 weeks DISCHARGE PLAN: Home, prob 1-2 days DISCHARGE NEEDS: HHPT, HHRN, CPM, Walker and 3-in-1 comode seat     [name] J 07/16/2013, 7:33 AM

## 2013-07-17 LAB — CBC
HCT: 36.9 % — ABNORMAL LOW (ref 39.0–52.0)
Hemoglobin: 12.3 g/dL — ABNORMAL LOW (ref 13.0–17.0)
MCH: 27.3 pg (ref 26.0–34.0)
Platelets: 149 10*3/uL — ABNORMAL LOW (ref 150–400)
RBC: 4.51 MIL/uL (ref 4.22–5.81)
WBC: 11.4 10*3/uL — ABNORMAL HIGH (ref 4.0–10.5)

## 2013-07-17 LAB — GLUCOSE, CAPILLARY
Glucose-Capillary: 148 mg/dL — ABNORMAL HIGH (ref 70–99)
Glucose-Capillary: 150 mg/dL — ABNORMAL HIGH (ref 70–99)

## 2013-07-17 NOTE — Progress Notes (Signed)
Physical Therapy Treatment Patient Details Name: Melvin Montoya MRN: [id number] DOB: May 30, 1951 Today's Date: 07/17/2013 Time: 3086-5784 PT Time Calculation (min): 39 min  PT Assessment / Plan / Recommendation  History of Present Illness Pt is a 62 y/o male admitted s/p R TKA.   PT Comments   Pt. Progressing well and safe for d/c home.  Follow Up Recommendations  Home health PT     Does the patient have the potential to tolerate intense rehabilitation     Barriers to Discharge        Equipment Recommendations  3in1 (PT)    Recommendations for Other Services    Frequency 7X/week   Progress towards PT Goals Progress towards PT goals: Progressing toward goals  Plan Current plan remains appropriate    Precautions / Restrictions Precautions Precautions: Fall;Knee Restrictions Weight Bearing Restrictions: Yes RLE Weight Bearing: Weight bearing as tolerated   Pertinent Vitals/Pain C/O R knee pain, did not rate. RN aware.    Mobility  Bed Mobility Bed Mobility: Not assessed Transfers Transfers: Sit to Stand;Stand to Sit Sit to Stand: 5: Supervision;With upper extremity assist;From chair/3-in-1 Stand to Sit: 5: Supervision;To chair/3-in-1;With upper extremity assist Details for Transfer Assistance: min VCs for correct technique Ambulation/Gait Ambulation/Gait Assistance: 5: Supervision Ambulation Distance (Feet): 140 Feet Assistive device: Rolling walker Gait Pattern: Step-to pattern;Decreased stride length;Narrow base of support Gait velocity: Decreased General Gait Details: cues for UE support to decrease forward flexion of trunk with RLE wt bearing Stairs: Yes Stairs Assistance: 4: Min guard Stair Management Technique: Backwards;With walker Number of Stairs: 1 Wheelchair Mobility Wheelchair Mobility: No    Exercises Total Joint Exercises Ankle Circles/Pumps: 10 reps Quad Sets: Right;10 reps;AROM Short Arc Quad: AAROM;Right;10 reps Heel Slides: AROM;Right;10  reps Hip ABduction/ADduction: AROM;Right;10 reps Straight Leg Raises: AROM;Right;10 reps Goniometric ROM: 0-35 AA   PT Diagnosis:    PT Problem List:   PT Treatment Interventions:     PT Goals (current goals can now be found in the care plan section) Acute Rehab PT Goals Patient Stated Goal: to go home PT Goal Formulation: With patient Time For Goal Achievement: 07/23/13 Potential to Achieve Goals: Good  Visit Information  Last PT Received On: 07/17/13 Assistance Needed: +1 History of Present Illness: Pt is a 62 y/o male admitted s/p R TKA.    Subjective Data  Subjective: "Can I go home?" Patient Stated Goal: to go home   Cognition  Cognition Arousal/Alertness: Awake/alert Behavior During Therapy: WFL for tasks assessed/performed Overall Cognitive Status: Within Functional Limits for tasks assessed    Balance  Balance Balance Assessed: Yes Static Sitting Balance Static Sitting - Balance Support: Feet supported;Bilateral upper extremity supported Static Sitting - Level of Assistance: 6: Modified independent (Device/Increase time) Static Standing Balance Static Standing - Balance Support: No upper extremity supported;During functional activity Static Standing - Level of Assistance: 5: Stand by assistance Static Standing - Comment/# of Minutes: 5  End of Session PT - End of Session Equipment Utilized During Treatment: Gait belt Activity Tolerance: Patient tolerated treatment well Patient left: in chair;with call bell/phone within reach Nurse Communication: Mobility status   GP     Moshe Cipro K 07/17/2013, 2:53 PM  Clarita Crane, PT, DPT [phone]

## 2013-07-17 NOTE — Progress Notes (Signed)
OT Cancellation Note and Discharge  Patient Details Name: Melvin Montoya MRN: [id number] DOB: 10-Jul-1951   Cancelled Treatment:    Reason Eval/Treat Not Completed: OT screened, no needs identified, will sign off. Pt reports he has "been through this rodeo before" and he and wife can manage BADLs. No OT needs identified, will sign off.  Evette Georges [phone] 07/17/2013, 2:45 PM

## 2013-07-17 NOTE — Discharge Summary (Signed)
Patient ID: KREGG CIHLAR MRN: [id number] DOB/AGE: 63-Sep-1952 63 y.o.  Admit date: 07/15/2013 Discharge date: 07/17/2013  Admission Diagnoses:  Principal Problem:   Arthritis of knee, right   Discharge Diagnoses:  Same  Past Medical History  Diagnosis Date  . Arthritis   . Chronic kidney disease     PT ON LISINOPRIL WATCHING FUNCT  . Diabetes mellitus   . Environmental allergies   . Enlarged prostate   . Pinched nerve in neck   . Headache(784.0)     thinks from pinched nerve in neck    Surgeries: Procedure(s): TOTAL KNEE ARTHROPLASTY on 07/15/2013   Consultants:    Discharged Condition: Improved  Hospital Course: Melvin Montoya is an 62 y.o. male who was admitted 07/15/2013 for operative treatment ofArthritis of knee, right. Patient has severe unremitting pain that affects sleep, daily activities, and work/hobbies. After pre-op clearance the patient was taken to the operating room on 07/15/2013 and underwent  Procedure(s): TOTAL KNEE ARTHROPLASTY.    Patient was given perioperative antibiotics: Anti-infectives   Start     Dose/Rate Route Frequency Ordered Stop   07/15/13 1451  cefUROXime (ZINACEF) injection  Status:  Discontinued       As needed 07/15/13 1451 07/15/13 1542   07/15/13 0600  ceFAZolin (ANCEF) IVPB 2 g/50 mL premix     2 g 100 mL/hr over 30 Minutes Intravenous On call to O.R. 07/14/13 1419 07/15/13 1322       Patient was given sequential compression devices, early ambulation, and chemoprophylaxis to prevent DVT.  Patient benefited maximally from hospital stay and there were no complications.    Recent vital signs: Patient Vitals for the past 24 hrs:  BP Temp Temp src Pulse Resp SpO2  07/17/13 0601 155/75 mmHg 98.4 F (36.9 C) Oral 98 16 97 %  07/16/13 2019 155/68 mmHg 98.4 F (36.9 C) Oral 91 16 97 %  07/16/13 1618 - - - - 16 98 %  07/16/13 1336 156/73 mmHg 98.4 F (36.9 C) - 79 18 97 %  07/16/13 1152 - - - - 16 98 %  07/16/13 0829 - -  - - 18 97 %     Recent laboratory studies:  Recent Labs  07/16/13 0500 07/17/13 0520  WBC 12.9* 11.4*  HGB 12.8* 12.3*  HCT 38.5* 36.9*  PLT 203 149*     Discharge Medications:     Medication List    STOP taking these medications       aspirin 81 MG tablet  Replaced by:  aspirin EC 325 MG tablet     cyclobenzaprine 10 MG tablet  Commonly known as:  FLEXERIL     HYDROcodone-acetaminophen 5-325 MG per tablet  Commonly known as:  NORCO/VICODIN      TAKE these medications       aspirin EC 325 MG tablet  Take 1 tablet (325 mg total) by mouth 2 (two) times daily.     ezetimibe-simvastatin 10-20 MG per tablet  Commonly known as:  VYTORIN  Take 1 tablet by mouth every morning.     finasteride 5 MG tablet  Commonly known as:  PROSCAR  Take 5 mg by mouth every evening.     ibuprofen 200 MG tablet  Commonly known as:  ADVIL,MOTRIN  Take 400 mg by mouth every 6 (six) hours as needed for pain.     lisinopril 20 MG tablet  Commonly known as:  PRINIVIL,ZESTRIL  Take 20 mg by mouth at bedtime.  meloxicam 15 MG tablet  Commonly known as:  MOBIC  Take 15 mg by mouth daily.     methocarbamol 500 MG tablet  Commonly known as:  ROBAXIN  Take 1 tablet (500 mg total) by mouth 2 (two) times daily with a meal.     mometasone 50 MCG/ACT nasal spray  Commonly known as:  NASONEX  Place 2 sprays into the nose every evening.     multivitamins ther. w/minerals Tabs tablet  Take 1 tablet by mouth at bedtime.     omega-3 acid ethyl esters 1 G capsule  Commonly known as:  LOVAZA  Take 1 g by mouth 2 (two) times daily.     oxyCODONE-acetaminophen 5-325 MG per tablet  Commonly known as:  ROXICET  Take 1 tablet by mouth every 4 (four) hours as needed for pain.     pioglitazone 30 MG tablet  Commonly known as:  ACTOS  Take 30 mg by mouth at bedtime.     tamsulosin 0.4 MG Caps capsule  Commonly known as:  FLOMAX  Take 0.4 mg by mouth at bedtime.        Diagnostic  Studies: Dg Chest 2 View  07/07/2013   CLINICAL DATA:  Preop for knee surgery, diabetes  EXAM: CHEST  2 VIEW  COMPARISON:  Chest x-ray of 08/10/2011  FINDINGS: No active infiltrate or effusion is seen. Mediastinal contours appear stable. The heart is within normal limits in size. No acute bony abnormality is seen. Surgical clips are noted in the right upper quadrant from prior cholecystectomy.  IMPRESSION: Stable chest x-ray. No active lung disease.   Electronically Signed   By: Dwyane Dee M.D.   On: 07/07/2013 09:45    Disposition: 01-Home or Self Care      Discharge Orders   Future Appointments Provider Department Dept Phone   08/06/2013 9:00 AM Kristian Covey, MD [facility] [phone]   Future Orders Complete By Expires   Call MD / Call 911  As directed    Comments:     If you experience chest pain or shortness of breath, CALL 911 and be transported to the hospital emergency room.  If you develope a fever above 101 F, pus (white drainage) or increased drainage or redness at the wound, or calf pain, call your surgeon's office.   Change dressing  As directed    Comments:     Change dressing on 5, then change the dressing daily with sterile 4 x 4 inch gauze dressing and apply TED hose.  You may clean the incision with alcohol prior to redressing.   Constipation Prevention  As directed    Comments:     Drink plenty of fluids.  Prune juice may be helpful.  You may use a stool softener, such as Colace (over the counter) 100 mg twice a day.  Use MiraLax (over the counter) for constipation as needed.   CPM  As directed    Comments:     Continuous passive motion machine (CPM):      Use the CPM from 0 to 60  for 5 hours per day.      You may increase by 10 degrees per day.  You may break it up into 2 or 3 sessions per day.      Use CPM for 2 weeks or until you are told to stop.   Diet - low sodium heart healthy  As directed    Discharge instructions  As directed  Comments:     Follow up 2 weeks in office with Dr. Turner Daniels   Driving restrictions  As directed    Comments:     No driving for 2 weeks   Increase activity slowly as tolerated  As directed    Patient may shower  As directed    Comments:     You may shower without a dressing once there is no drainage.  Do not wash over the wound.  If drainage remains, cover wound with plastic wrap and then shower.      Follow-up Information   Follow up with [name], MD In 2 weeks.   Specialty:  Orthopedic Surgery   Contact information:   [address] [place] [address] [phone]        Signed: Vear Clock Camauri Fleece R 07/17/2013, 7:25 AM

## 2013-07-17 NOTE — Progress Notes (Signed)
PATIENT ID: JAHZIER VILLALON  MRN: [id number]  DOB/AGE:  Jun 20, 1951 / 62 y.o.  2 Days Post-Op Procedure(s) (LRB): TOTAL KNEE ARTHROPLASTY (Right)    PROGRESS NOTE Subjective: Patient is alert, oriented, no Nausea, no Vomiting, yes passing gas, no Bowel Movement. Taking PO well. Denies SOB, Chest or Calf Pain. Using Incentive Spirometer, PAS in place. Ambulate WBAt, CPM 0-35 Patient reports pain as moderate  .    Objective: Vital signs in last 24 hours: Filed Vitals:   07/16/13 1336 07/16/13 1618 07/16/13 2019 07/17/13 0601  BP: 156/73  155/68 155/75  Pulse: 79  91 98  Temp: 98.4 F (36.9 C)  98.4 F (36.9 C) 98.4 F (36.9 C)  TempSrc:   Oral Oral  Resp: 18 16 16 16   SpO2: 97% 98% 97% 97%      Intake/Output from previous day: I/O last 3 completed shifts: In: 2967.1 [P.O.:840; I.V.:2127.1] Out: 3415 [Urine:3215; Drains:200]   Intake/Output this shift:     LABORATORY DATA:  Recent Labs  07/16/13 0500  07/16/13 1634 07/16/13 2154 07/17/13 0520 07/17/13 0654  WBC 12.9*  --   --   --  11.4*  --   HGB 12.8*  --   --   --  12.3*  --   HCT 38.5*  --   --   --  36.9*  --   PLT 203  --   --   --  149*  --   GLUCAP  --   < > 116* 156*  --  148*  < > = values in this interval not displayed.  Examination: Neurologically intact Neurovascular intact Sensation intact distally Intact pulses distally Dorsiflexion/Plantar flexion intact Incision: scant drainage No cellulitis present Compartment soft}  Assessment:   2 Days Post-Op Procedure(s) (LRB): TOTAL KNEE ARTHROPLASTY (Right) ADDITIONAL DIAGNOSIS:  none  Plan: PT/OT WBAT, CPM 5/hrs day until ROM 0-90 degrees, then D/C CPM DVT Prophylaxis:  SCDx72hrs, ASA 325 mg BID x 2 weeks DISCHARGE PLAN: Home when pt passe PT goals DISCHARGE NEEDS: HHPT, HHRN, CPM, Walker and 3-in-1 comode seat     PHILLIPS, ERIC R 07/17/2013, 7:19 AM

## 2013-07-17 NOTE — Progress Notes (Signed)
Patient provided with discharge instructions and follow up information. He is going home with HHPT through [facility]. He is going home with support from his wife.

## 2013-07-17 NOTE — Progress Notes (Signed)
Physical Therapy Treatment Patient Details Name: Melvin Montoya MRN: [id number] DOB: 12-21-1950 Today's Date: 07/17/2013 Time: 7829-5621 PT Time Calculation (min): 40 min  PT Assessment / Plan / Recommendation  History of Present Illness Pt is a 62 y/o male admitted s/p R TKA.   PT Comments   Pt. With improved activity tolerance and mobility during AM session  Follow Up Recommendations  Home health PT     Does the patient have the potential to tolerate intense rehabilitation     Barriers to Discharge        Equipment Recommendations  3in1 (PT)    Recommendations for Other Services    Frequency 7X/week   Progress towards PT Goals Progress towards PT goals: Progressing toward goals  Plan Current plan remains appropriate    Precautions / Restrictions Precautions Precautions: Fall;Knee Restrictions Weight Bearing Restrictions: Yes RLE Weight Bearing: Weight bearing as tolerated   Pertinent Vitals/Pain 6/10 pain R knee; RN notified and medications administered.    Mobility  Bed Mobility Bed Mobility: Sit to Supine Supine to Sit: 6: Modified independent (Device/Increase time);HOB elevated (15 degrees) Sitting - Scoot to Edge of Bed: 5: Supervision Details for Bed Mobility Assistance: min cues for technique Transfers Transfers: Sit to Stand;Stand to Sit Sit to Stand: 4: Min guard;From bed Stand to Sit: 5: Supervision;To chair/3-in-1;With upper extremity assist Details for Transfer Assistance: min cues for safe technique Ambulation/Gait Ambulation/Gait Assistance: 5: Supervision Ambulation Distance (Feet): 100 Feet Assistive device: Rolling walker Ambulation/Gait Assistance Details: VCs for sequencing and UE support Gait Pattern: Step-to pattern;Decreased stride length;Narrow base of support Gait velocity: Decreased    Exercises Total Joint Exercises Ankle Circles/Pumps: 10 reps Quad Sets: 10 reps   PT Diagnosis:    PT Problem List:   PT Treatment Interventions:      PT Goals (current goals can now be found in the care plan section) Acute Rehab PT Goals Patient Stated Goal: To return home PT Goal Formulation: With patient Time For Goal Achievement: 07/23/13 Potential to Achieve Goals: Good  Visit Information  Last PT Received On: 07/17/13 Assistance Needed: +1 History of Present Illness: Pt is a 62 y/o male admitted s/p R TKA.    Subjective Data  Subjective: "Did I do ok?" Patient Stated Goal: To return home   Cognition  Cognition Arousal/Alertness: Awake/alert Behavior During Therapy: WFL for tasks assessed/performed Overall Cognitive Status: Within Functional Limits for tasks assessed    Balance  Balance Balance Assessed: Yes Static Sitting Balance Static Sitting - Balance Support: Feet supported;Bilateral upper extremity supported Static Sitting - Level of Assistance: 6: Modified independent (Device/Increase time) Static Sitting - Comment/# of Minutes: 5 Static Standing Balance Static Standing - Balance Support: Bilateral upper extremity supported;During functional activity Static Standing - Level of Assistance: 5: Stand by assistance Static Standing - Comment/# of Minutes: 3  End of Session PT - End of Session Equipment Utilized During Treatment: Gait belt Activity Tolerance: Patient tolerated treatment well Patient left: in chair;with call bell/phone within reach Nurse Communication: Mobility status CPM Right Knee CPM Right Knee: Off   GP     Moshe Cipro K 07/17/2013, 8:25 AM  Clarita Crane, PT, DPT [phone]

## 2013-08-06 ENCOUNTER — Encounter: Payer: Self-pay | Admitting: Family Medicine

## 2013-08-06 ENCOUNTER — Ambulatory Visit (INDEPENDENT_AMBULATORY_CARE_PROVIDER_SITE_OTHER): Payer: BC Managed Care – PPO | Admitting: Family Medicine

## 2013-08-06 VITALS — BP 122/70 | HR 77 | Temp 98.1°F | Ht 67.0 in | Wt 265.0 lb

## 2013-08-06 DIAGNOSIS — Z860100 Personal history of colon polyps, unspecified: Secondary | ICD-10-CM | POA: Insufficient documentation

## 2013-08-06 DIAGNOSIS — M1990 Unspecified osteoarthritis, unspecified site: Secondary | ICD-10-CM

## 2013-08-06 DIAGNOSIS — J309 Allergic rhinitis, unspecified: Secondary | ICD-10-CM

## 2013-08-06 DIAGNOSIS — E119 Type 2 diabetes mellitus without complications: Secondary | ICD-10-CM | POA: Insufficient documentation

## 2013-08-06 DIAGNOSIS — N400 Enlarged prostate without lower urinary tract symptoms: Secondary | ICD-10-CM

## 2013-08-06 NOTE — Progress Notes (Signed)
  Subjective:    Patient ID: Melvin Montoya, male    DOB: [birth year], 62 y.o.   MRN: [id number]  HPI Patient is seen to establish care. He has history of osteoarthritis involving multiple joints, type 2 diabetes, dyslipidemia, BPH, allergic rhinitis He's had both knees replaced, most recently right total knee replacement October 22 of this year. He is getting around amazingly well. He had a hemoglobin A1c of 6.7% during recent hospitalization.  Currently takes only Pioglitazone for his diabetes. Blood sugars been slightly elevated since his surgery. A1c's have apparently been consistently controlled. Last eye exam last May reportedly normal. No history of any diabetes complications.  He has history of dyslipidemia treated with Vytorin. He is on lisinopril apparently not because of hypertension but he may have had some mild microproteinuria at one point based on his discussion today. He is compliant with all medications. He takes finasteride and tamsulosin for BPH and those symptoms are stable.  Past Medical History  Diagnosis Date  . Arthritis   . Chronic kidney disease     PT ON LISINOPRIL WATCHING FUNCT  . Diabetes mellitus   . Environmental allergies   . Enlarged prostate   . Pinched nerve in neck   . Headache(784.0)     thinks from pinched nerve in neck  . Allergy   . Chicken pox    Past Surgical History  Procedure Laterality Date  . Cholecystectomy  1980  . Wrist surgery Left 2005  . Knee arthroscopy Bilateral   . Hernia repair  2010    UMBILICAL   . Total knee arthroplasty  08/20/2011    Procedure: TOTAL KNEE ARTHROPLASTY;  Surgeon: [name];  Location: [facility] OR;  Service: Orthopedics;  Laterality: Left;  . Appendectomy      thinks he had it taken out with his gallbladder  . Tonsillectomy    . Colonoscopy    . Vasectomy    . Total knee arthroplasty Right 10//22/2014  . Total knee arthroplasty Right 07/15/2013    Procedure: TOTAL KNEE ARTHROPLASTY;  Surgeon: [name], MD;  Location: [facility] OR;  Service: Orthopedics;  Laterality: Right;    reports that he has never smoked. He has never used smokeless tobacco. He reports that he drinks alcohol. He reports that he does not use illicit drugs. family history is not on file. No Known Allergies    Review of Systems  Constitutional: Negative for fatigue.  Eyes: Negative for visual disturbance.  Respiratory: Negative for cough, chest tightness and shortness of breath.   Cardiovascular: Negative for chest pain, palpitations and leg swelling.  Musculoskeletal: Positive for arthralgias.  Neurological: Negative for dizziness, syncope, weakness, light-headedness and headaches.       Objective:   Physical Exam  Constitutional: He appears well-developed and well-nourished.  Neck: Neck supple. No thyromegaly present.  Cardiovascular: Normal rate and regular rhythm.   Pulmonary/Chest: Effort normal and breath sounds normal. No respiratory distress. He has no wheezes. He has no rales.  Musculoskeletal: He exhibits no edema.          Assessment & Plan:  #1 type 2 diabetes. History of good control. Future labs ordered for repeat A1c #2 obesity. We discussed strategies for weight loss. He's had previous diabetes education and nutritionist though remotely #3 history of BPH symptomatically stable #4 allergic rhinitis #5 osteoarthritis involving multiple joints with history of bilateral knee replacements

## 2013-08-06 NOTE — Progress Notes (Signed)
Pre visit review using our clinic review tool, if applicable. No additional management support is needed unless otherwise documented below in the visit note. 

## 2013-09-29 ENCOUNTER — Other Ambulatory Visit (INDEPENDENT_AMBULATORY_CARE_PROVIDER_SITE_OTHER): Payer: BC Managed Care – PPO

## 2013-09-29 DIAGNOSIS — E119 Type 2 diabetes mellitus without complications: Secondary | ICD-10-CM

## 2013-09-29 DIAGNOSIS — Z0000 Encounter for general adult medical examination without abnormal findings: Secondary | ICD-10-CM

## 2013-09-29 LAB — MICROALBUMIN / CREATININE URINE RATIO
Creatinine,U: 156.2 mg/dL
Microalb Creat Ratio: 1.3 mg/g (ref 0.0–30.0)
Microalb, Ur: 2 mg/dL — ABNORMAL HIGH (ref 0.0–1.9)

## 2013-09-29 LAB — LIPID PANEL
Cholesterol: 121 mg/dL (ref 0–200)
HDL: 41.2 mg/dL (ref >39.00)
LDL Cholesterol: 67 mg/dL (ref 0–99)
TRIGLYCERIDES: 65 mg/dL (ref 0.0–149.0)
Total CHOL/HDL Ratio: 3
VLDL: 13 mg/dL (ref 0.0–40.0)

## 2013-09-29 LAB — CBC WITH DIFFERENTIAL/PLATELET
BASOS ABS: 0 10*3/uL (ref 0.0–0.1)
Basophils Relative: 0.2 % (ref 0.0–3.0)
Eosinophils Absolute: 0.2 10*3/uL (ref 0.0–0.7)
Eosinophils Relative: 3 % (ref 0.0–5.0)
HCT: 41.8 % (ref 39.0–52.0)
HEMOGLOBIN: 13.9 g/dL (ref 13.0–17.0)
Lymphocytes Relative: 21.8 % (ref 12.0–46.0)
Lymphs Abs: 1.4 10*3/uL (ref 0.7–4.0)
MCHC: 33.2 g/dL (ref 30.0–36.0)
MCV: 80.5 fl (ref 78.0–100.0)
MONOS PCT: 7.7 % (ref 3.0–12.0)
Monocytes Absolute: 0.5 10*3/uL (ref 0.1–1.0)
NEUTROS ABS: 4.4 10*3/uL (ref 1.4–7.7)
Neutrophils Relative %: 67.3 % (ref 43.0–77.0)
Platelets: 186 10*3/uL (ref 150.0–400.0)
RBC: 5.19 Mil/uL (ref 4.22–5.81)
RDW: 14.2 % (ref 11.5–14.6)
WBC: 6.6 10*3/uL (ref 4.5–10.5)

## 2013-09-29 LAB — POCT URINALYSIS DIPSTICK
BILIRUBIN UA: NEGATIVE
Glucose, UA: NEGATIVE
KETONES UA: NEGATIVE
Leukocytes, UA: NEGATIVE
Nitrite, UA: NEGATIVE
PH UA: 5.5
Protein, UA: NEGATIVE
RBC UA: NEGATIVE
SPEC GRAV UA: 1.02
Urobilinogen, UA: 0.2

## 2013-09-29 LAB — HEPATIC FUNCTION PANEL
ALK PHOS: 56 U/L (ref 39–117)
ALT: 17 U/L (ref 0–53)
AST: 20 U/L (ref 0–37)
Albumin: 3.9 g/dL (ref 3.5–5.2)
BILIRUBIN DIRECT: 0.1 mg/dL (ref 0.0–0.3)
BILIRUBIN TOTAL: 0.6 mg/dL (ref 0.3–1.2)
TOTAL PROTEIN: 6.2 g/dL (ref 6.0–8.3)

## 2013-09-29 LAB — BASIC METABOLIC PANEL
BUN: 14 mg/dL (ref 6–23)
CO2: 26 mEq/L (ref 19–32)
Calcium: 9.2 mg/dL (ref 8.4–10.5)
Chloride: 104 mEq/L (ref 96–112)
Creatinine, Ser: 1.1 mg/dL (ref 0.4–1.5)
GFR: 70.89 mL/min (ref >60.00)
GLUCOSE: 108 mg/dL — AB (ref 70–99)
POTASSIUM: 4.1 meq/L (ref 3.5–5.1)
SODIUM: 138 meq/L (ref 135–145)

## 2013-09-29 LAB — HEMOGLOBIN A1C: Hgb A1c MFr Bld: 6.7 % — ABNORMAL HIGH (ref 4.6–6.5)

## 2013-09-29 LAB — PSA: PSA: 0.65 ng/mL (ref 0.10–4.00)

## 2013-09-29 LAB — TSH: TSH: 1.22 u[IU]/mL (ref 0.35–5.50)

## 2013-10-06 ENCOUNTER — Other Ambulatory Visit: Payer: Self-pay

## 2013-10-06 ENCOUNTER — Ambulatory Visit (INDEPENDENT_AMBULATORY_CARE_PROVIDER_SITE_OTHER): Payer: BC Managed Care – PPO | Admitting: Family Medicine

## 2013-10-06 ENCOUNTER — Encounter: Payer: Self-pay | Admitting: Family Medicine

## 2013-10-06 VITALS — BP 126/64 | HR 74 | Temp 98.4°F | Wt 262.0 lb

## 2013-10-06 DIAGNOSIS — E119 Type 2 diabetes mellitus without complications: Secondary | ICD-10-CM

## 2013-10-06 DIAGNOSIS — B356 Tinea cruris: Secondary | ICD-10-CM

## 2013-10-06 MED ORDER — FINASTERIDE 5 MG PO TABS: 5.0000 mg | ORAL_TABLET | Freq: Every evening | ORAL | Status: DC

## 2013-10-06 MED ORDER — GLUCOSE BLOOD VI STRP: ORAL_STRIP | Status: DC

## 2013-10-06 MED ORDER — ONETOUCH DELICA LANCETS 33G MISC: Status: DC

## 2013-10-06 NOTE — Patient Instructions (Signed)
Continue with weight loss efforts.   Consider free APP such as MyFitnessPal for tracking calories.

## 2013-10-06 NOTE — Progress Notes (Signed)
   Subjective:    Patient ID: Melvin Montoya, male    DOB: 1951-06-27, 63 y.o.   MRN: [id number]  HPI Patient here for medical followup. Type 2 diabetes, obesity, osteoarthritis, BPH. He is already losing some weight and has weight loss plan in process.  Diabetes has been very well controlled. Recent labs reviewed. A1c 6.7%. She's had some recent issues with tinea cruris. Slightly improved but not totally relieved with over-the-counter medications. Had occasional problems with ingrown left great toenail but improved with conservative therapy. No current drainage or pain.  Patient has BPH well controlled with finasteride. Occasional erectile dysfunction.  Some fatigue.  Does not recall having prior testosterone level.    Past Medical History  Diagnosis Date  . Arthritis   . Chronic kidney disease     PT ON LISINOPRIL WATCHING FUNCT  . Diabetes mellitus   . Environmental allergies   . Enlarged prostate   . Pinched nerve in neck   . Headache(784.0)     thinks from pinched nerve in neck  . Allergy   . Chicken pox    Past Surgical History  Procedure Laterality Date  . Cholecystectomy  1980  . Wrist surgery Left 2005  . Knee arthroscopy Bilateral   . Hernia repair  9563    UMBILICAL   . Total knee arthroplasty  08/20/2011    Procedure: TOTAL KNEE ARTHROPLASTY;  Surgeon: Kerin Salen;  Location: [facility];  Service: Orthopedics;  Laterality: Left;  . Appendectomy      thinks he had it taken out with his gallbladder  . Tonsillectomy    . Colonoscopy    . Vasectomy    . Total knee arthroplasty Right 10//22/2014  . Total knee arthroplasty Right 07/15/2013    Procedure: TOTAL KNEE ARTHROPLASTY;  Surgeon: Kerin Salen, MD;  Location: [facility];  Service: Orthopedics;  Laterality: Right;    reports that he has never smoked. He has never used smokeless tobacco. He reports that he drinks alcohol. He reports that he does not use illicit drugs. family history is not on file. No Known  Allergies    Review of Systems  Constitutional: Negative for fatigue.  Eyes: Negative for visual disturbance.  Respiratory: Negative for cough, chest tightness and shortness of breath.   Cardiovascular: Negative for chest pain, palpitations and leg swelling.  Neurological: Negative for dizziness, syncope, weakness, light-headedness and headaches.       Objective:   Physical Exam  Constitutional: He is oriented to person, place, and time. He appears well-developed and well-nourished.  Cardiovascular: Normal rate and regular rhythm.   Pulmonary/Chest: Effort normal and breath sounds normal. No respiratory distress. He has no wheezes. He has no rales.  Musculoskeletal: He exhibits no edema.  Neurological: He is alert and oriented to person, place, and time. No cranial nerve deficit.          Assessment & Plan:  #1 type 2 diabetes. Well controlled. Continue current medications. Hopefully, he can reduce or discontinue Actos with continued weight loss #2 obesity. Weight loss strategies discussed. Suggested App such as "MyFitnessPal" to help track calorie intake and expenditures. #3 hypertension well controlled #4 tinea cruris. Improved with OTC medications

## 2013-10-06 NOTE — Progress Notes (Signed)
Pre visit review using our clinic review tool, if applicable. No additional management support is needed unless otherwise documented below in the visit note. 

## 2013-10-07 ENCOUNTER — Telehealth: Payer: Self-pay

## 2013-10-07 NOTE — Telephone Encounter (Signed)
Relevant patient education assigned to patient using Emmi. ° °

## 2013-10-23 ENCOUNTER — Telehealth: Payer: Self-pay | Admitting: Family Medicine

## 2013-10-23 MED ORDER — EZETIMIBE-SIMVASTATIN 10-20 MG PO TABS: 1.0000 | ORAL_TABLET | ORAL | Status: DC

## 2013-10-23 MED ORDER — TAMSULOSIN HCL 0.4 MG PO CAPS: 0.4000 mg | ORAL_CAPSULE | Freq: Every day | ORAL | Status: DC

## 2013-10-23 NOTE — Telephone Encounter (Signed)
Rx's sent to pharmacy.  

## 2013-10-23 NOTE — Telephone Encounter (Signed)
Pt is requesting new rx tamsulosin hci(flomax) 0.4 mg and vytorin 10-25 mg 90 day supply,[facility], pt states [facility] refused to call.

## 2013-11-20 ENCOUNTER — Telehealth: Payer: Self-pay | Admitting: Family Medicine

## 2013-11-20 MED ORDER — PIOGLITAZONE HCL 30 MG PO TABS: 30.0000 mg | ORAL_TABLET | Freq: Every day | ORAL | Status: DC

## 2013-11-20 MED ORDER — LISINOPRIL 20 MG PO TABS: 20.0000 mg | ORAL_TABLET | Freq: Every day | ORAL | Status: DC

## 2013-11-20 NOTE — Telephone Encounter (Signed)
Pt is needing new rx pioglitazone (ACTOS) 30 MG tablet, and lisinopril 20 mg, sent  [facility] 90 day supply.

## 2013-11-20 NOTE — Telephone Encounter (Signed)
[facility]

## 2013-12-29 ENCOUNTER — Ambulatory Visit (INDEPENDENT_AMBULATORY_CARE_PROVIDER_SITE_OTHER): Payer: BC Managed Care – PPO | Admitting: Family Medicine

## 2013-12-29 ENCOUNTER — Encounter: Payer: Self-pay | Admitting: Family Medicine

## 2013-12-29 VITALS — BP 100/70 | Temp 98.1°F | Wt 234.0 lb

## 2013-12-29 DIAGNOSIS — R222 Localized swelling, mass and lump, trunk: Secondary | ICD-10-CM

## 2013-12-29 NOTE — Patient Instructions (Signed)
-  We placed a referral for you as discussed for the [place] of your breast. It usually takes about 1-2 weeks to process and schedule this referral. If you have not heard from [place] regarding this appointment in 2 weeks please contact our office.  -follow up with PCP in 1 month or as advised

## 2013-12-29 NOTE — Progress Notes (Signed)
Chief Complaint  Patient presents with  . non-painful lump on chest    HPI:  Lump on Chest: -reports found small bump on chest while lying in bed -not sure how long it had been there -not itchy or painful -did not look red -denies: fevers, malaise, weight loss unintentional - on diet and exercise program and is loosing weight with that  ROS: See pertinent positives and negatives per HPI.  Past Medical History  Diagnosis Date  . Arthritis   . Chronic kidney disease     PT ON LISINOPRIL WATCHING FUNCT  . Diabetes mellitus   . Environmental allergies   . Enlarged prostate   . Pinched nerve in neck   . Headache(784.0)     thinks from pinched nerve in neck  . Allergy   . Chicken pox     Past Surgical History  Procedure Laterality Date  . Cholecystectomy  1980  . Wrist surgery Left 2005  . Knee arthroscopy Bilateral   . Hernia repair  1610    UMBILICAL   . Total knee arthroplasty  08/20/2011    Procedure: TOTAL KNEE ARTHROPLASTY;  Surgeon: Kerin Salen;  Location: [facility] [place] [facility];  Service: Orthopedics;  Laterality: Left;  . Appendectomy      thinks he had it taken out with his gallbladder  . Tonsillectomy    . Colonoscopy    . Vasectomy    . Total knee arthroplasty Right 10//22/2014  . Total knee arthroplasty Right 07/15/2013    Procedure: TOTAL KNEE ARTHROPLASTY;  Surgeon: Kerin Salen, MD;  Location: [place];  Service: Orthopedics;  Laterality: Right;    No family history on file.  History   Social History  . Marital Status: Married    Spouse Name: N/A    Number of Children: N/A  . Years of Education: N/A   Social History Main Topics  . Smoking status: Never Smoker   . Smokeless tobacco: Never Used  . Alcohol Use: Yes     Comment: very occasional  . Drug Use: No  . Sexual Activity: None   Other Topics Concern  . None   Social History Narrative  . None    Current outpatient prescriptions:aspirin EC 325 MG tablet, Take 1 tablet (325 mg total) by mouth 2  (two) times daily., Disp: 30 tablet, Rfl: 0;  ezetimibe-simvastatin (VYTORIN) 10-20 MG per tablet, Take 1 tablet by mouth every morning., Disp: 90 tablet, Rfl: 3;  finasteride (PROSCAR) 5 MG tablet, Take 1 tablet (5 mg total) by mouth every evening., Disp: 90 tablet, Rfl: 3 glucose blood (ONETOUCH VERIO) test strip, Once daily before breakfast DX: 250.00, Disp: 100 each, Rfl: 3;  ibuprofen (ADVIL,MOTRIN) 200 MG tablet, Take 400 mg by mouth every 6 (six) hours as needed for pain., Disp: , Rfl: ;  lisinopril (PRINIVIL,ZESTRIL) 20 MG tablet, Take 1 tablet (20 mg total) by mouth at bedtime., Disp: 90 tablet, Rfl: 3;  meloxicam (MOBIC) 15 MG tablet, Take 15 mg by mouth daily as needed. , Disp: , Rfl:  methocarbamol (ROBAXIN) 500 MG tablet, Take 1 tablet (500 mg total) by mouth 2 (two) times daily with a meal., Disp: 60 tablet, Rfl: 0;  mometasone (NASONEX) 50 MCG/ACT nasal spray, Place 2 sprays into the nose every evening. , Disp: , Rfl: ;  Multiple Vitamins-Minerals (MULTIVITAMINS THER. W/MINERALS) TABS, Take 1 tablet by mouth at bedtime.  , Disp: , Rfl:  omega-3 acid ethyl esters (LOVAZA) 1 G capsule, Take 1 g by mouth 2 (  two) times daily. , Disp: , Rfl: ;  Omega-3 Fatty Acids (FISH OIL) 1200 MG CAPS, Take by mouth., Disp: , Rfl: ;  ONETOUCH DELICA LANCETS 28U MISC, Once daily before breakfast DX: 250.00, Disp: 100 each, Rfl: 3;  oxyCODONE-acetaminophen (ROXICET) 5-325 MG per tablet, Take 1 tablet by mouth every 4 (four) hours as needed for pain., Disp: 60 tablet, Rfl: 0 pioglitazone (ACTOS) 30 MG tablet, Take 1 tablet (30 mg total) by mouth at bedtime., Disp: 90 tablet, Rfl: 3;  tamsulosin (FLOMAX) 0.4 MG CAPS capsule, Take 1 capsule (0.4 mg total) by mouth at bedtime., Disp: 90 capsule, Rfl: 3;  amoxicillin (AMOXIL) 500 MG capsule, Take 4 tablets before going to appointment, when only going to the dentist., Disp: , Rfl:   EXAM:  Filed Vitals:   12/29/13 1000  BP: 100/70  Temp: 98.1 F (36.7 C)    Body  mass index is 36.64 kg/(m^2).  GENERAL: vitals reviewed and listed above, alert, oriented, appears well hydrated and in no acute distress  HEENT: atraumatic, conjunttiva clear, no obvious abnormalities on inspection of external nose and ears  NECK: no obvious masses on inspection  SKIN/BREAST: 2cm x 1cm firm subcutaneous nodule - approx 10cm from nipple at 11 o'clock, no other masses noted  MS: moves all extremities without noticeable abnormality  PSYCH: pleasant and cooperative, no obvious depression or anxiety  ASSESSMENT AND PLAN:  Discussed the following assessment and plan:  Mass of left chest wall - Plan: US BREAST COMPLETE UNI LEFT INC AXILLA  --we discussed possible serious and likely etiologies, workup and treatment, treatment risks and return precautions -after this discussion, Kyan opted for starting with [place] and then he may wish to have this removed regardless and at that point would refer to surgeon -follow up advised pending result -of course, we advised [place]  to return or notify a doctor immediately if symptoms worsen or persist or new concerns arise.   -Patient advised to return or notify a doctor immediately if symptoms worsen or persist or new concerns arise.  Patient Instructions  -We placed a referral for you as discussed for the [place] of your breast. It usually takes about 1-2 weeks to process and schedule this referral. If you have not heard from [place] regarding this appointment in 2 weeks please contact our office.  -follow up with PCP in 1 month or as advised      KIM, HANNAH R.

## 2013-12-29 NOTE — Progress Notes (Signed)
Pre visit review using our clinic review tool, if applicable. No additional management support is needed unless otherwise documented below in the visit note. 

## 2013-12-30 ENCOUNTER — Other Ambulatory Visit: Payer: Self-pay | Admitting: Family Medicine

## 2013-12-30 DIAGNOSIS — R222 Localized swelling, mass and lump, trunk: Secondary | ICD-10-CM

## 2014-01-06 ENCOUNTER — Ambulatory Visit
Admission: RE | Admit: 2014-01-06 | Discharge: 2014-01-06 | Disposition: A | Discharge status: Home / Self Care | Payer: BC Managed Care – PPO | Source: Ambulatory Visit | Attending: Family Medicine | Admitting: Family Medicine

## 2014-01-06 ENCOUNTER — Other Ambulatory Visit: Payer: Self-pay | Admitting: Family Medicine

## 2014-01-06 DIAGNOSIS — R222 Localized swelling, mass and lump, trunk: Secondary | ICD-10-CM

## 2014-01-11 ENCOUNTER — Ambulatory Visit
Admission: RE | Admit: 2014-01-11 | Discharge: 2014-01-11 | Disposition: A | Discharge status: Home / Self Care | Payer: BC Managed Care – PPO | Source: Ambulatory Visit | Attending: Family Medicine | Admitting: Family Medicine

## 2014-01-11 ENCOUNTER — Other Ambulatory Visit: Payer: Self-pay | Admitting: Family Medicine

## 2014-01-11 DIAGNOSIS — N63 Unspecified lump in unspecified breast: Secondary | ICD-10-CM

## 2014-01-11 DIAGNOSIS — R222 Localized swelling, mass and lump, trunk: Secondary | ICD-10-CM

## 2014-01-19 ENCOUNTER — Encounter (INDEPENDENT_AMBULATORY_CARE_PROVIDER_SITE_OTHER): Payer: Self-pay | Admitting: Surgery

## 2014-01-19 ENCOUNTER — Other Ambulatory Visit (INDEPENDENT_AMBULATORY_CARE_PROVIDER_SITE_OTHER): Payer: Self-pay | Admitting: Surgery

## 2014-01-19 ENCOUNTER — Ambulatory Visit (INDEPENDENT_AMBULATORY_CARE_PROVIDER_SITE_OTHER): Payer: BC Managed Care – PPO | Admitting: Surgery

## 2014-01-19 VITALS — BP 122/72 | HR 74 | Temp 97.5°F | Resp 14 | Ht 67.0 in | Wt 224.4 lb

## 2014-01-19 DIAGNOSIS — C493 Malignant neoplasm of connective and soft tissue of thorax: Secondary | ICD-10-CM

## 2014-01-19 DIAGNOSIS — R222 Localized swelling, mass and lump, trunk: Secondary | ICD-10-CM | POA: Insufficient documentation

## 2014-01-19 LAB — BUN: BUN: 13 mg/dL (ref 6–23)

## 2014-01-19 LAB — CREATININE, SERUM: Creat: 1.09 mg/dL (ref 0.50–1.35)

## 2014-01-19 NOTE — Progress Notes (Signed)
Patient ID: Melvin Montoya, male   DOB: 1951-02-21, 63 y.o.   MRN: [id number]  Chief Complaint  Patient presents with  . eval left chest sarcoma    HPI Melvin Montoya is a 63 y.o. male.   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 The patient felt the mass on his chest right before Easter of this year. He had no discomfort from the mass. He presented to his primary care physician. An ultrasound and mammogram were performed. This was followed up by a biopsy showing a spindle cell mass worrisome for a sarcoma. He has no previous history of sarcomas. He denies any chest wall trauma. He has had some mild left shoulder discomfort after the biopsy. He has a mild cough which he attributes to allergies. He denies any recent weight loss or weight gain. Past Medical History  Diagnosis Date  . Arthritis   . Chronic kidney disease     PT ON LISINOPRIL WATCHING FUNCT  . Diabetes mellitus   . Environmental allergies   . Enlarged prostate   . Pinched nerve in neck   . Headache(784.0)     thinks from pinched nerve in neck  . Allergy   . Chicken pox     Past Surgical History  Procedure Laterality Date  . Cholecystectomy  1980  . Wrist surgery Left 2005  . Knee arthroscopy Bilateral   . Hernia repair  5427    UMBILICAL   . Total knee arthroplasty  08/20/2011    Procedure: TOTAL KNEE ARTHROPLASTY;  Surgeon: Kerin Salen;  Location: [place];  Service: Orthopedics;  Laterality: Left;  . Appendectomy      thinks he had it taken out with his gallbladder  . Tonsillectomy    . Colonoscopy    . Vasectomy    . Total knee arthroplasty Right 10//22/2014  . Total knee arthroplasty Right 07/15/2013    Procedure: TOTAL KNEE ARTHROPLASTY;  Surgeon: Kerin Salen, MD;  Location: [place];  Service: Orthopedics;  Laterality: Right;    Family History  Problem Relation Age of Onset  . Heart disease Mother   . Heart  disease Father     Social History History  Substance Use Topics  . Smoking status: Never Smoker   . Smokeless tobacco: Never Used  . Alcohol Use: Yes     Comment: very occasional    No Known Allergies  Current Outpatient Prescriptions  Medication Sig Dispense Refill  . amoxicillin (AMOXIL) 500 MG capsule Take 4 tablets before going to appointment, when only going to the dentist.      . aspirin EC 325 MG tablet Take 1 tablet (325 mg total) by mouth 2 (two) times daily.  30 tablet  0  . ezetimibe-simvastatin (VYTORIN) 10-20 MG per tablet Take 1 tablet by mouth every morning.  90 tablet  3  . finasteride (PROSCAR) 5 MG tablet Take 1 tablet (5 mg total) by mouth every evening.  90 tablet  3  . glucose blood (ONETOUCH VERIO) test strip Once daily before breakfast DX: 250.00  100 each  3  . ibuprofen (ADVIL,MOTRIN) 200 MG tablet Take 400 mg by mouth every 6 (six) hours as needed for pain.      Marland Kitchen lisinopril (PRINIVIL,ZESTRIL) 20 MG tablet Take 1 tablet (20 mg total) by mouth at bedtime.  90 tablet  3  . meloxicam (MOBIC) 15 MG tablet  Take 15 mg by mouth daily as needed.       . methocarbamol (ROBAXIN) 500 MG tablet Take 1 tablet (500 mg total) by mouth 2 (two) times daily with a meal.  60 tablet  0  . mometasone (NASONEX) 50 MCG/ACT nasal spray Place 2 sprays into the nose every evening.       . Multiple Vitamins-Minerals (MULTIVITAMINS THER. W/MINERALS) TABS Take 1 tablet by mouth at bedtime.        Marland Kitchen omega-3 acid ethyl esters (LOVAZA) 1 G capsule Take 1 g by mouth 2 (two) times daily.       . Omega-3 Fatty Acids (FISH OIL) 1200 MG CAPS Take by mouth.      Glory Rosebush DELICA LANCETS 83T MISC Once daily before breakfast DX: 250.00  100 each  3  . pioglitazone (ACTOS) 30 MG tablet Take 1 tablet (30 mg total) by mouth at bedtime.  90 tablet  3  . tamsulosin (FLOMAX) 0.4 MG CAPS capsule Take 1 capsule (0.4 mg total) by mouth at bedtime.  90 capsule  3   No current facility-administered  medications for this visit.    Review of Systems Review of Systems  Constitutional: Negative for fever, chills and unexpected weight change.  HENT: Negative for congestion, hearing loss, sore throat, trouble swallowing and voice change.   Eyes: Negative for visual disturbance.  Respiratory: Positive for cough. Negative for wheezing.   Cardiovascular: Negative for chest pain, palpitations and leg swelling.  Gastrointestinal: Negative for nausea, vomiting, abdominal pain, diarrhea, constipation, blood in stool, abdominal distention, anal bleeding and rectal pain.  Genitourinary: Negative for hematuria and difficulty urinating.  Musculoskeletal: Negative for arthralgias.  Skin: Negative for rash and wound.  Neurological: Negative for seizures, syncope, weakness and headaches.  Hematological: Negative for adenopathy. Does not bruise/bleed easily.  Psychiatric/Behavioral: Negative for confusion.  All other systems reviewed and are negative.   Blood pressure 122/72, pulse 74, temperature 97.5 F (36.4 C), resp. rate 14, height 5\' 7"  (1.702 m), weight 224 lb 6.4 oz (101.787 kg).  Physical Exam Physical Exam  Constitutional: He is oriented to person, place, and time. He appears well-developed and well-nourished. No distress.  HENT:  Head: Normocephalic and atraumatic.  Right Ear: External ear normal.  Left Ear: External ear normal.  Nose: Nose normal.  Mouth/Throat: Oropharynx is clear and moist.  Eyes: Conjunctivae are normal. Pupils are equal, round, and reactive to light. Right eye exhibits no discharge. Left eye exhibits no discharge. No scleral icterus.  Neck: Normal range of motion. Neck supple. No tracheal deviation present.  Cardiovascular: Normal rate, regular rhythm, normal heart sounds and intact distal pulses.   No murmur heard. Pulmonary/Chest: Effort normal and breath sounds normal. No respiratory distress. He has no wheezes. He has no rales.  There is a 2 cm chest wall  mass on left side at the 11 to 12:00 position approximately 12 cm above the nipple. It is deep and only slightly mobile.  Abdominal: Soft. Bowel sounds are normal. He exhibits no distension. There is no tenderness. There is no rebound.  Multiple well-healed incisions with no evidence of hernia  Musculoskeletal: Normal range of motion. He exhibits no edema and no tenderness.  Lymphadenopathy:    He has no cervical adenopathy.    He has no axillary adenopathy.  Neurological: He is alert and oriented to person, place, and time.  Skin: Skin is warm and dry. No rash noted. He is not diaphoretic. No erythema.  Psychiatric:  His behavior is normal. Judgment normal.    Data Reviewed Ultrasound and mammogram demonstrated a 2 cm mass which is deep in the subcutaneous tissue of the left upper chest. 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 He needs preoperative staging of this mass. I recommend a CAT scan of the chest with contrast to see if the mass is involving the underlying ribs and musculature as well as to determine if there is any advanced disease in the axilla or chest. If it is determined that the mass is involving ribs or underlying muscle, I will need to have cardiothoracic surgery consulted for eventual wide excision. I will call back with the results of the CAT scan and then make plans accordingly.        Harl Bowie 01/19/2014, 9:44 AM

## 2014-01-20 ENCOUNTER — Ambulatory Visit
Admission: RE | Admit: 2014-01-20 | Discharge: 2014-01-20 | Disposition: A | Discharge status: Home / Self Care | Payer: BC Managed Care – PPO | Source: Ambulatory Visit | Attending: Surgery | Admitting: Surgery

## 2014-01-20 ENCOUNTER — Other Ambulatory Visit (INDEPENDENT_AMBULATORY_CARE_PROVIDER_SITE_OTHER): Payer: Self-pay | Admitting: Surgery

## 2014-01-20 DIAGNOSIS — C493 Malignant neoplasm of connective and soft tissue of thorax: Secondary | ICD-10-CM

## 2014-01-20 MED ORDER — IOHEXOL 300 MG/ML  SOLN
75.0000 mL | Freq: Once | INTRAMUSCULAR | Status: AC | PRN
  Administered 2014-01-20: 75 mL via INTRAVENOUS

## 2014-01-21 ENCOUNTER — Encounter (HOSPITAL_COMMUNITY): Payer: Self-pay | Admitting: Pharmacy Technician

## 2014-01-22 ENCOUNTER — Encounter (HOSPITAL_COMMUNITY)
Admission: RE | Admit: 2014-01-22 | Discharge: 2014-01-22 | Disposition: A | Discharge status: Home / Self Care | Payer: BC Managed Care – PPO | Source: Ambulatory Visit | Attending: Surgery | Admitting: Surgery

## 2014-01-22 ENCOUNTER — Encounter (HOSPITAL_COMMUNITY): Payer: Self-pay

## 2014-01-22 DIAGNOSIS — Z01812 Encounter for preprocedural laboratory examination: Secondary | ICD-10-CM | POA: Insufficient documentation

## 2014-01-22 LAB — BASIC METABOLIC PANEL
BUN: 15 mg/dL (ref 6–23)
CO2: 25 mEq/L (ref 19–32)
CREATININE: 1.18 mg/dL (ref 0.50–1.35)
Calcium: 9.5 mg/dL (ref 8.4–10.5)
Chloride: 104 mEq/L (ref 96–112)
GFR calc Af Amer: 74 mL/min — ABNORMAL LOW (ref >90)
GFR calc non Af Amer: 63 mL/min — ABNORMAL LOW (ref >90)
Glucose, Bld: 101 mg/dL — ABNORMAL HIGH (ref 70–99)
Potassium: 4.4 mEq/L (ref 3.7–5.3)
Sodium: 142 mEq/L (ref 137–147)

## 2014-01-22 LAB — CBC
HEMATOCRIT: 42 % (ref 39.0–52.0)
Hemoglobin: 14.1 g/dL (ref 13.0–17.0)
MCH: 27.2 pg (ref 26.0–34.0)
MCHC: 33.6 g/dL (ref 30.0–36.0)
MCV: 81.1 fL (ref 78.0–100.0)
PLATELETS: 167 10*3/uL (ref 150–400)
RBC: 5.18 MIL/uL (ref 4.22–5.81)
RDW: 15 % (ref 11.5–15.5)
WBC: 6.1 10*3/uL (ref 4.0–10.5)

## 2014-01-22 NOTE — Pre-Procedure Instructions (Signed)
TAZ VANNESS  01/22/2014   Your procedure is scheduled on:  Thursday, May 7th  Report to [facility] at 0530 AM.  Call this number if you have problems the morning of surgery: [phone]   Remember:   Do not eat food or drink liquids after midnight.   Take these medicines the morning of surgery with A SIP OF WATER: none  Stop taking aspirin, otc vitamins/herbal medications, fish oil, NSAIDS (ibuprofen, advil, meloxicam) as of today.   Do not wear jewelry.  Do not wear lotions, powders, or perfumes. You may wear deodorant.  Do not shave 48 hours prior to surgery. Men may shave face and neck.  Do not bring valuables to the hospital.  [facility] is not responsible  for any belongings or valuables.               Contacts, dentures or bridgework may not be worn into surgery.  Leave suitcase in the car. After surgery it may be brought to your room.  For patients admitted to the hospital, discharge time is determined by your  treatment team.               Patients discharged the day of surgery will not be allowed to drive home.  Please read over the following fact sheets that you were given: Pain Booklet, Coughing and Deep Breathing and Surgical Site Infection Prevention [place] - Preparing for Surgery  Before surgery, you can play an important role.  Because skin is not sterile, your skin needs to be as free of germs as possible.  You can reduce the number of germs on you skin by washing with CHG (chlorahexidine gluconate) soap before surgery.  CHG is an antiseptic cleaner which kills germs and bonds with the skin to continue killing germs even after washing.  Please DO NOT use if you have an allergy to CHG or antibacterial soaps.  If your skin becomes reddened/irritated stop using the CHG and inform your nurse when you arrive at Short Stay.  Do not shave (including legs and underarms) for at least 48 hours prior to the first CHG shower.  You may shave your  face.  Please follow these instructions carefully:   1.  Shower with CHG Soap the night before surgery and the morning of Surgery.  2.  If you choose to wash your hair, wash your hair first as usual with your normal shampoo.  3.  After you shampoo, rinse your hair and body thoroughly to remove the shampoo.  4.  Use CHG as you would any other liquid soap.  You can apply CHG directly to the skin and wash gently with scrungie or a clean washcloth.  5.  Apply the CHG Soap to your body ONLY FROM THE NECK DOWN.  Do not use on open wounds or open sores.  Avoid contact with your eyes, ears, mouth and genitals (private parts).  Wash genitals (private parts) with your normal soap.  6.  Wash thoroughly, paying special attention to the area where your surgery will be performed.  7.  Thoroughly rinse your body with warm water from the neck down.  8.  DO NOT shower/wash with your normal soap after using and rinsing off the CHG Soap.  9.  Pat yourself dry with a clean towel.            10.  Wear clean pajamas.            11.  Place clean sheets on your bed the night of your first shower and do not sleep with pets.  Day of Surgery  Do not apply any lotions/deoderants the morning of surgery.  Please wear clean clothes to the hospital/surgery center.

## 2014-01-22 NOTE — Progress Notes (Signed)
Primary physician - dr. Elease Hashimoto ekg in epic from oct 2014 no other cardiac testing

## 2014-01-27 MED ORDER — CEFAZOLIN SODIUM-DEXTROSE 2-3 GM-% IV SOLR
2.0000 g | INTRAVENOUS | Status: AC
  Administered 2014-01-28: 2 g via INTRAVENOUS

## 2014-01-28 ENCOUNTER — Encounter (HOSPITAL_COMMUNITY): Payer: Self-pay | Admitting: *Deleted

## 2014-01-28 ENCOUNTER — Encounter (HOSPITAL_COMMUNITY): Payer: BC Managed Care – PPO | Admitting: Anesthesiology

## 2014-01-28 ENCOUNTER — Encounter (HOSPITAL_COMMUNITY)
Admission: RE | Disposition: A | Discharge status: Home / Self Care | Payer: Self-pay | Source: Ambulatory Visit | Attending: Surgery

## 2014-01-28 ENCOUNTER — Ambulatory Visit (HOSPITAL_COMMUNITY)
Admission: RE | Admit: 2014-01-28 | Discharge: 2014-01-28 | Disposition: A | Discharge status: Home / Self Care | Payer: BC Managed Care – PPO | Source: Ambulatory Visit | Attending: Surgery | Admitting: Surgery

## 2014-01-28 ENCOUNTER — Ambulatory Visit (HOSPITAL_COMMUNITY): Payer: BC Managed Care – PPO | Admitting: Anesthesiology

## 2014-01-28 DIAGNOSIS — C493 Malignant neoplasm of connective and soft tissue of thorax: Secondary | ICD-10-CM | POA: Insufficient documentation

## 2014-01-28 DIAGNOSIS — M129 Arthropathy, unspecified: Secondary | ICD-10-CM | POA: Insufficient documentation

## 2014-01-28 DIAGNOSIS — G589 Mononeuropathy, unspecified: Secondary | ICD-10-CM | POA: Insufficient documentation

## 2014-01-28 DIAGNOSIS — N189 Chronic kidney disease, unspecified: Secondary | ICD-10-CM | POA: Insufficient documentation

## 2014-01-28 DIAGNOSIS — J301 Allergic rhinitis due to pollen: Secondary | ICD-10-CM | POA: Insufficient documentation

## 2014-01-28 DIAGNOSIS — N400 Enlarged prostate without lower urinary tract symptoms: Secondary | ICD-10-CM | POA: Insufficient documentation

## 2014-01-28 DIAGNOSIS — E119 Type 2 diabetes mellitus without complications: Secondary | ICD-10-CM | POA: Insufficient documentation

## 2014-01-28 DIAGNOSIS — Z7982 Long term (current) use of aspirin: Secondary | ICD-10-CM | POA: Insufficient documentation

## 2014-01-28 DIAGNOSIS — Z96659 Presence of unspecified artificial knee joint: Secondary | ICD-10-CM | POA: Insufficient documentation

## 2014-01-28 DIAGNOSIS — R51 Headache: Secondary | ICD-10-CM | POA: Insufficient documentation

## 2014-01-28 HISTORY — PX: MASS EXCISION: SHX2000

## 2014-01-28 LAB — GLUCOSE, CAPILLARY
Glucose-Capillary: 92 mg/dL (ref 70–99)
Glucose-Capillary: 88 mg/dL (ref 70–99)

## 2014-01-28 SURGERY — EXCISION MASS
Anesthesia: General | Site: Chest | Laterality: Left

## 2014-01-28 MED ORDER — LIDOCAINE HCL (CARDIAC) 20 MG/ML IV SOLN
INTRAVENOUS | Status: DC | PRN
  Administered 2014-01-28: 80 mg via INTRAVENOUS

## 2014-01-28 MED ORDER — ONDANSETRON HCL 4 MG/2ML IJ SOLN
INTRAMUSCULAR | Status: DC | PRN
  Administered 2014-01-28: 4 mg via INTRAVENOUS

## 2014-01-28 MED ORDER — PROMETHAZINE HCL 25 MG/ML IJ SOLN: 6.2500 mg | INTRAMUSCULAR | Status: DC | PRN

## 2014-01-28 MED ORDER — BUPIVACAINE-EPINEPHRINE (PF) 0.5% -1:200000 IJ SOLN
INTRAMUSCULAR | Status: AC
  Filled 2014-01-28: qty 30

## 2014-01-28 MED ORDER — FENTANYL CITRATE 0.05 MG/ML IJ SOLN
INTRAMUSCULAR | Status: DC | PRN
  Administered 2014-01-28 (×2): 50 ug via INTRAVENOUS

## 2014-01-28 MED ORDER — NEOSTIGMINE METHYLSULFATE 10 MG/10ML IV SOLN
INTRAVENOUS | Status: AC
  Filled 2014-01-28: qty 1

## 2014-01-28 MED ORDER — PROPOFOL 10 MG/ML IV BOLUS
INTRAVENOUS | Status: DC | PRN
  Administered 2014-01-28: 200 mg via INTRAVENOUS

## 2014-01-28 MED ORDER — OXYCODONE HCL 5 MG PO TABS: 5.0000 mg | ORAL_TABLET | Freq: Once | ORAL | Status: DC | PRN

## 2014-01-28 MED ORDER — 0.9 % SODIUM CHLORIDE (POUR BTL) OPTIME
TOPICAL | Status: DC | PRN
  Administered 2014-01-28: 1000 mL

## 2014-01-28 MED ORDER — MIDAZOLAM HCL 2 MG/2ML IJ SOLN
INTRAMUSCULAR | Status: AC
  Filled 2014-01-28: qty 2

## 2014-01-28 MED ORDER — ONDANSETRON HCL 4 MG/2ML IJ SOLN
INTRAMUSCULAR | Status: AC
  Filled 2014-01-28: qty 2

## 2014-01-28 MED ORDER — BUPIVACAINE-EPINEPHRINE (PF) 0.5% -1:200000 IJ SOLN
INTRAMUSCULAR | Status: DC | PRN
  Administered 2014-01-28: 30 mL

## 2014-01-28 MED ORDER — LACTATED RINGERS IV SOLN
INTRAVENOUS | Status: DC | PRN
  Administered 2014-01-28: 07:00:00 via INTRAVENOUS

## 2014-01-28 MED ORDER — MIDAZOLAM HCL 5 MG/5ML IJ SOLN
INTRAMUSCULAR | Status: DC | PRN
  Administered 2014-01-28: 2 mg via INTRAVENOUS

## 2014-01-28 MED ORDER — OXYCODONE-ACETAMINOPHEN 5-325 MG PO TABS: 1.0000 | ORAL_TABLET | ORAL | Status: DC | PRN

## 2014-01-28 MED ORDER — OXYCODONE HCL 5 MG/5ML PO SOLN: 5.0000 mg | Freq: Once | ORAL | Status: DC | PRN

## 2014-01-28 MED ORDER — GLYCOPYRROLATE 0.2 MG/ML IJ SOLN
INTRAMUSCULAR | Status: AC
  Filled 2014-01-28: qty 3

## 2014-01-28 MED ORDER — LIDOCAINE HCL (CARDIAC) 20 MG/ML IV SOLN
INTRAVENOUS | Status: AC
  Filled 2014-01-28: qty 5

## 2014-01-28 MED ORDER — FENTANYL CITRATE 0.05 MG/ML IJ SOLN
INTRAMUSCULAR | Status: AC
  Filled 2014-01-28: qty 5

## 2014-01-28 MED ORDER — ROCURONIUM BROMIDE 50 MG/5ML IV SOLN
INTRAVENOUS | Status: AC
  Filled 2014-01-28: qty 1

## 2014-01-28 MED ORDER — HYDROMORPHONE HCL PF 1 MG/ML IJ SOLN: 0.2500 mg | INTRAMUSCULAR | Status: DC | PRN

## 2014-01-28 MED ORDER — PHENYLEPHRINE HCL 10 MG/ML IJ SOLN
INTRAMUSCULAR | Status: DC | PRN
  Administered 2014-01-28 (×4): 80 ug via INTRAVENOUS

## 2014-01-28 MED ORDER — BUPIVACAINE-EPINEPHRINE (PF) 0.25% -1:200000 IJ SOLN
INTRAMUSCULAR | Status: AC
  Filled 2014-01-28: qty 30

## 2014-01-28 MED ORDER — PROPOFOL 10 MG/ML IV BOLUS
INTRAVENOUS | Status: AC
  Filled 2014-01-28: qty 20

## 2014-01-28 SURGICAL SUPPLY — 40 items
APPLIER CLIP 9.375 SM OPEN (CLIP) ×3
CANISTER SUCTION 2500CC (MISCELLANEOUS) ×3 IMPLANT
CLIP APPLIE 9.375 SM OPEN (CLIP) ×1 IMPLANT
CLOSURE STERI-STRIP 1/2X4 (GAUZE/BANDAGES/DRESSINGS) ×1
CLOSURE WOUND 1/2 X4 (GAUZE/BANDAGES/DRESSINGS) ×1
CLSR STERI-STRIP ANTIMIC 1/2X4 (GAUZE/BANDAGES/DRESSINGS) ×2 IMPLANT
COVER SURGICAL LIGHT HANDLE (MISCELLANEOUS) ×3 IMPLANT
DRAPE PED LAPAROTOMY (DRAPES) ×3 IMPLANT
DRAPE UTILITY 15X26 W/TAPE STR (DRAPE) ×6 IMPLANT
DRSG TEGADERM 4X4.75 (GAUZE/BANDAGES/DRESSINGS) ×3 IMPLANT
ELECT CAUTERY BLADE 6.4 (BLADE) ×3 IMPLANT
ELECT REM PT RETURN 9FT ADLT (ELECTROSURGICAL) ×3
ELECTRODE REM PT RTRN 9FT ADLT (ELECTROSURGICAL) ×1 IMPLANT
GLOVE BIOGEL PI IND STRL 6.5 (GLOVE) ×1 IMPLANT
GLOVE BIOGEL PI INDICATOR 6.5 (GLOVE) ×2
GLOVE SS BIOGEL STRL SZ 7 (GLOVE) ×1 IMPLANT
GLOVE SUPERSENSE BIOGEL SZ 7 (GLOVE) ×2
GLOVE SURG SIGNA 7.5 PF LTX (GLOVE) ×3 IMPLANT
GOWN STRL REUS W/ TWL LRG LVL3 (GOWN DISPOSABLE) ×1 IMPLANT
GOWN STRL REUS W/ TWL XL LVL3 (GOWN DISPOSABLE) ×1 IMPLANT
GOWN STRL REUS W/TWL LRG LVL3 (GOWN DISPOSABLE) ×2
GOWN STRL REUS W/TWL XL LVL3 (GOWN DISPOSABLE) ×2
KIT BASIN OR (CUSTOM PROCEDURE TRAY) ×3 IMPLANT
KIT MARKER MARGIN INK (KITS) ×3 IMPLANT
KIT ROOM TURNOVER OR (KITS) ×3 IMPLANT
NEEDLE HYPO 25X1 1.5 SAFETY (NEEDLE) ×3 IMPLANT
NS IRRIG 1000ML POUR BTL (IV SOLUTION) ×3 IMPLANT
PACK SURGICAL SETUP 50X90 (CUSTOM PROCEDURE TRAY) ×3 IMPLANT
PAD ARMBOARD 7.5X6 YLW CONV (MISCELLANEOUS) ×6 IMPLANT
PENCIL BUTTON HOLSTER BLD 10FT (ELECTRODE) ×3 IMPLANT
SPONGE GAUZE 4X4 12PLY (GAUZE/BANDAGES/DRESSINGS) ×3 IMPLANT
SPONGE LAP 18X18 X RAY DECT (DISPOSABLE) ×3 IMPLANT
STRIP CLOSURE SKIN 1/2X4 (GAUZE/BANDAGES/DRESSINGS) ×2 IMPLANT
SUT MNCRL AB 4-0 PS2 18 (SUTURE) ×6 IMPLANT
SUT VIC AB 3-0 SH 27 (SUTURE) ×4
SUT VIC AB 3-0 SH 27X BRD (SUTURE) ×1 IMPLANT
SUT VIC AB 3-0 SH 27XBRD (SUTURE) ×1 IMPLANT
SYR BULB 3OZ (MISCELLANEOUS) ×3 IMPLANT
SYR CONTROL 10ML LL (SYRINGE) ×3 IMPLANT
TOWEL OR 17X26 10 PK STRL BLUE (TOWEL DISPOSABLE) ×3 IMPLANT

## 2014-01-28 NOTE — Discharge Instructions (Signed)
Bandage is waterproof.  You can shower with it on.  You may remove the bandage tomorrow or Saturday. You can shower, just no soaking in a tub or swimming for one week. Leave the steri-strips on the incision until they peel off.  You may use and ice pack for pain as well.  I will call you as soon as I know the final pathology results which may not be until Tuesday next week.

## 2014-01-28 NOTE — H&P (Signed)
Chief Complaint   Patient presents with   .  eval left chest sarcoma   HPI  Melvin Montoya is a 63 y.o. male.  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 The patient felt the mass on his chest right before Easter of this year. He had no discomfort from the mass. He presented to his primary care physician. An ultrasound and mammogram were performed. This was followed up by a biopsy showing a spindle cell mass worrisome for a sarcoma. He has no previous history of sarcomas. He denies any chest wall trauma. He has had some mild left shoulder discomfort after the biopsy. He has a mild cough which he attributes to allergies. He denies any recent weight loss or weight gain.  Past Medical History   Diagnosis  Date   .  Arthritis    .  Chronic kidney disease      PT ON LISINOPRIL WATCHING FUNCT   .  Diabetes mellitus    .  Environmental allergies    .  Enlarged prostate    .  Pinched nerve in neck    .  Headache(784.0)      thinks from pinched nerve in neck   .  Allergy    .  Chicken pox     Past Surgical History   Procedure  Laterality  Date   .  Cholecystectomy   1980   .  Wrist surgery  Left  2005   .  Knee arthroscopy  Bilateral    .  Hernia repair   1610     UMBILICAL   .  Total knee arthroplasty   08/20/2011     Procedure: TOTAL KNEE ARTHROPLASTY; Surgeon: Kerin Salen; Location: [place]; Service: Orthopedics; Laterality: Left;   .  Appendectomy       thinks he had it taken out with his gallbladder   .  Tonsillectomy     .  Colonoscopy     .  Vasectomy     .  Total knee arthroplasty  Right  10//22/2014   .  Total knee arthroplasty  Right  07/15/2013     Procedure: TOTAL KNEE ARTHROPLASTY; Surgeon: Kerin Salen, MD; Location: [place]; Service: Orthopedics; Laterality: Right;    Family History   Problem  Relation  Age of Onset   .  Heart disease  Mother    .  Heart disease  Father     Social History  History   Substance Use Topics   .  Smoking status:  Never Smoker   .  Smokeless tobacco:  Never Used   .  Alcohol Use:  Yes      Comment: very occasional   No Known Allergies  Current Outpatient Prescriptions   Medication  Sig  Dispense  Refill   .  amoxicillin (AMOXIL) 500 MG capsule  Take 4 tablets before going to appointment, when only going to the dentist.     .  aspirin EC 325 MG tablet  Take 1 tablet (325 mg total) by mouth 2 (two) times daily.  30 tablet  0   .  ezetimibe-simvastatin (VYTORIN) 10-20 MG per tablet  Take 1 tablet by mouth every morning.  90 tablet  3   .  finasteride (PROSCAR) 5 MG tablet  Take 1 tablet (5 mg total) by mouth every evening.  90 tablet  3   .  glucose blood (ONETOUCH VERIO) test strip  Once daily before breakfast DX: 250.00  100 each  3   .  ibuprofen (ADVIL,MOTRIN) 200 MG tablet  Take 400 mg by mouth every 6 (six) hours as needed for pain.     Marland Kitchen  lisinopril (PRINIVIL,ZESTRIL) 20 MG tablet  Take 1 tablet (20 mg total) by mouth at bedtime.  90 tablet  3   .  meloxicam (MOBIC) 15 MG tablet  Take 15 mg by mouth daily as needed.     .  methocarbamol (ROBAXIN) 500 MG tablet  Take 1 tablet (500 mg total) by mouth 2 (two) times daily with a meal.  60 tablet  0   .  mometasone (NASONEX) 50 MCG/ACT nasal spray  Place 2 sprays into the nose every evening.     .  Multiple Vitamins-Minerals (MULTIVITAMINS THER. W/MINERALS) TABS  Take 1 tablet by mouth at bedtime.     Marland Kitchen  omega-3 acid ethyl esters (LOVAZA) 1 G capsule  Take 1 g by mouth 2 (two) times daily.     .  Omega-3 Fatty Acids (FISH OIL) 1200 MG CAPS  Take by mouth.     Glory Rosebush DELICA LANCETS 51O MISC  Once daily before breakfast DX: 250.00  100 each  3   .  pioglitazone (ACTOS) 30 MG tablet  Take 1 tablet (30 mg total) by mouth at bedtime.  90 tablet  3   .  tamsulosin (FLOMAX) 0.4 MG CAPS capsule  Take 1 capsule (0.4 mg total) by mouth at bedtime.  90 capsule  3    No current  facility-administered medications for this visit.   Review of Systems  Review of Systems  Constitutional: Negative for fever, chills and unexpected weight change.  HENT: Negative for congestion, hearing loss, sore throat, trouble swallowing and voice change.  Eyes: Negative for visual disturbance.  Respiratory: Positive for cough. Negative for wheezing.  Cardiovascular: Negative for chest pain, palpitations and leg swelling.  Gastrointestinal: Negative for nausea, vomiting, abdominal pain, diarrhea, constipation, blood in stool, abdominal distention, anal bleeding and rectal pain.  Genitourinary: Negative for hematuria and difficulty urinating.  Musculoskeletal: Negative for arthralgias.  Skin: Negative for rash and wound.  Neurological: Negative for seizures, syncope, weakness and headaches.  Hematological: Negative for adenopathy. Does not bruise/bleed easily.  Psychiatric/Behavioral: Negative for confusion.  All other systems reviewed and are negative.  Blood pressure 122/72, pulse 74, temperature 97.5 F (36.4 C), resp. rate 14, height 5\' 7"  (1.702 m), weight 224 lb 6.4 oz (101.787 kg).  Physical Exam  Physical Exam  Constitutional: He is oriented to person, place, and time. He appears well-developed and well-nourished. No distress.  HENT:  Head: Normocephalic and atraumatic.  Right Ear: External ear normal.  Left Ear: External ear normal.  Nose: Nose normal.  Mouth/Throat: Oropharynx is clear and moist.  Eyes: Conjunctivae are normal. Pupils are equal, round, and reactive to light. Right eye exhibits no discharge. Left eye exhibits no discharge. No scleral icterus.  Neck: Normal range of motion. Neck supple. No tracheal deviation present.  Cardiovascular: Normal rate, regular rhythm, normal heart sounds and intact distal pulses.  No murmur heard.  Pulmonary/Chest: Effort normal and breath sounds normal. No respiratory distress. He has no wheezes. He has no rales.  There is a 2 cm  chest wall mass on left side at the 11 to 12:00 position approximately 12 cm above the nipple. It is deep and only slightly mobile.  Abdominal: Soft.  Bowel sounds are normal. He exhibits no distension. There is no tenderness. There is no rebound.  Multiple well-healed incisions with no evidence of hernia  Musculoskeletal: Normal range of motion. He exhibits no edema and no tenderness.  Lymphadenopathy:  He has no cervical adenopathy.  He has no axillary adenopathy.  Neurological: He is alert and oriented to person, place, and time.  Skin: Skin is warm and dry. No rash noted. He is not diaphoretic. No erythema.  Psychiatric: His behavior is normal. Judgment normal.  Data Reviewed  Ultrasound and mammogram demonstrated a 2 cm mass which is deep in the subcutaneous tissue of the left upper chest. I have the biopsy results showing spindle cells with marked atypia and findings worrisome for a sarcoma  Assessment  2 cm left chest wall mass worrisome for sarcoma  Plan  I discussed the diagnosis with the patient and his wife in detail. He needs preoperative staging of this mass. I recommend a CAT scan of the chest with contrast to see if the mass is involving the underlying ribs and musculature as well as to determine if there is any advanced disease in the axilla or chest. If it is determined that the mass is involving ribs or underlying muscle, I will need to have cardiothoracic surgery consulted for eventual wide excision. I will call back with the results of the CAT scan and then make plans accordingly.   Addendum:  The mass is 1.6 x 1.5cm in the soft tissue of the left upper chest on CT.  It does not appear to invade into the pectoralis muscle.  Will plan on wide excision of the left chest wall mass.  I discussed the risks which include but are not limited to bleeding, infection, need for further surgery if margins are positive, injury to surrounding structures, recurrence, etc.  He agrees to  proceed.

## 2014-01-28 NOTE — Anesthesia Preprocedure Evaluation (Addendum)
Anesthesia Evaluation  Patient identified by MRN, date of birth, ID band Patient awake    Reviewed: Allergy & Precautions  History of Anesthesia Complications Negative for: history of anesthetic complications  Airway Mallampati: I TM Distance: >3 FB Neck ROM: Full    Dental  (+) Teeth Intact, Dental Advisory Given, Chipped   Pulmonary neg pulmonary ROS,  breath sounds clear to auscultation        Cardiovascular negative cardio ROS  Rhythm:Regular Rate:Normal     Neuro/Psych  Headaches, negative psych ROS   GI/Hepatic negative GI ROS, Neg liver ROS,   Endo/Other  diabetes, Well Controlled, Type 2  Renal/GU Observation for CKD because of Lisinopril     Musculoskeletal negative musculoskeletal ROS (+)   Abdominal   Peds  Hematology negative hematology ROS (+)   Anesthesia Other Findings   Reproductive/Obstetrics                          Anesthesia Physical Anesthesia Plan  ASA: III  Anesthesia Plan: General   Post-op Pain Management:    Induction: Intravenous  Airway Management Planned: LMA  Additional Equipment:   Intra-op Plan:   Post-operative Plan: Extubation in OR  Informed Consent: I have reviewed the patients History and Physical, chart, labs and discussed the procedure including the risks, benefits and alternatives for the proposed anesthesia with the patient or authorized representative who has indicated his/her understanding and acceptance.   Dental advisory given  Plan Discussed with: CRNA and Surgeon  Anesthesia Plan Comments:         Anesthesia Quick Evaluation

## 2014-01-28 NOTE — Anesthesia Postprocedure Evaluation (Signed)
  Anesthesia Post-op Note  Patient: Melvin Montoya  Procedure(s) Performed: Procedure(s): EXCISION LEFT CHEST WALL  MASS (Left)  Patient Location: PACU  Anesthesia Type:General  Level of Consciousness: awake and alert   Airway and Oxygen Therapy: Patient Spontanous Breathing  Post-op Pain: mild  Post-op Assessment: Post-op Vital signs reviewed  Post-op Vital Signs: stable  Last Vitals:  Filed Vitals:   01/28/14 0858  BP: 120/54  Pulse: 59  Temp: 36.4 C  Resp: 9    Complications: No apparent anesthesia complications

## 2014-01-28 NOTE — Op Note (Addendum)
NAMETARANCE, BALAN NO.:  [id number]  MEDICAL RECORD NO.:  [id number]  LOCATION:  [facility]                         FACILITY:  [facility]  PHYSICIAN:  Coralie Keens, M.D. DATE OF BIRTH:  05/10/1951  DATE OF PROCEDURE:  01/28/2014 DATE OF DISCHARGE:  01/28/2014                              OPERATIVE REPORT   PREOPERATIVE DIAGNOSIS:  Left chest wall mass.  POSTOPERATIVE DIAGNOSIS:  Left chest wall mass.  PROCEDURE:  Resection of left chest wall mass. (36 cm square of soft tissue, fascia, and muscle)  SURGEON:  Coralie Keens, M.D.  ANESTHESIA:  General and 0.5% Marcaine with epinephrine.  ESTIMATED BLOOD LOSS:  Minimal.  INDICATIONS:  This is a 63 year old gentleman who presented with a left chest wall mass.  He had a biopsy of the mass showing spindle cells and findings worrisome for a possible sarcoma.  A preoperative CAT scan showed that the mass was in subcutaneous tissue and abutted the pectoralis fascia, but down appeared to invade into the fascia.  Wide resection was recommended.    PROCEDURE IN DETAIL:  The patient was brought to the operating room, identified Melvin Montoya.  He was placed supine on the operating room table and general anesthesia was induced.  His left chest was then prepped and draped in usual sterile fashion.  The mass was located between the 12 and 1 o'clock position of the chest, several inches from the areola.  I anesthetized the skin over top of the mass with Marcaine. I then made a transverse incision with a scalpel.  I took this down to subcutaneous tissue with electrocautery.  I then performed a wide resection going all the way down to the pectoralis fascia.  The mass did not appear fixed to the fascia, but I took fascia and underlying pectoralis muscle and this may be a sarcoma. Total resection size is 36 cm square It appeared to be several centimeters around the mass in all directions.  Once it was removed, I marked all  margins with marker paint.  Hemostasis appeared to be achieved.  I then anesthetized the tissue with further Marcaine.  I then placed surgical clips in the cavity around the margins for identification purposes.  I then closed the subcutaneous tissue with interrupted 3-0 Vicryl sutures and closed the skin with a running 4-0 Monocryl.  Steri-Strips, gauze, and Tegaderm were then applied.  The patient tolerated the procedure well. All counts were correct at the end of procedure.  The patient was then extubated in the operating room and taken in stable condition to recovery room.     Coralie Keens, M.D.     DB/MEDQ  D:  01/28/2014  T:  01/28/2014  Job:  [id number]

## 2014-01-28 NOTE — Op Note (Signed)
EXCISION LEFT CHEST WALL  MASS  Procedure Note  Melvin Montoya 01/28/2014   Pre-op Diagnosis: Left chest wall mass      Post-op Diagnosis: same  Procedure(s): RESECTION LEFT CHEST WALL  MASS (2 cm)  Surgeon(s): Harl Bowie, MD  Anesthesia: General  Staff:  Circulator: Jaynie Crumble, RN Scrub Person: Silva Bandy Teschner, CST  Estimated Blood Loss: Minimal               Specimens: SENT TO [facility]          Harl Bowie   Date: 01/28/2014  Time: 8:07 AM

## 2014-01-28 NOTE — Transfer of Care (Signed)
Immediate Anesthesia Transfer of Care Note  Patient: Melvin Montoya  Procedure(s) Performed: Procedure(s): EXCISION LEFT CHEST WALL  MASS (Left)  Patient Location: PACU  Anesthesia Type:General  Level of Consciousness: awake, alert  and oriented  Airway & Oxygen Therapy: Patient Spontanous Breathing  Post-op Assessment: Report given to PACU RN and Post -op Vital signs reviewed and stable  Post vital signs: Reviewed and stable  Complications: No apparent anesthesia complications

## 2014-01-29 ENCOUNTER — Encounter (HOSPITAL_COMMUNITY): Payer: Self-pay | Admitting: Surgery

## 2014-02-01 ENCOUNTER — Telehealth (INDEPENDENT_AMBULATORY_CARE_PROVIDER_SITE_OTHER): Payer: Self-pay | Admitting: Surgery

## 2014-02-01 ENCOUNTER — Other Ambulatory Visit (INDEPENDENT_AMBULATORY_CARE_PROVIDER_SITE_OTHER): Payer: Self-pay | Admitting: Surgery

## 2014-02-01 DIAGNOSIS — C493 Malignant neoplasm of connective and soft tissue of thorax: Secondary | ICD-10-CM

## 2014-02-01 NOTE — Telephone Encounter (Signed)
The final pathology showed a leiomyosarcoma. Margins were negative. The concern is whether this is metastatic disease. He has had a CAT scan of the chest which Scan approximately 1/3 down into the abdomen and was negative For an intra-thoracic sarcoma or mass or least a mass in the upper abdomen. He needs a CAT scan of the rest of his abdomen and pelvis to rule out a primary sarcoma In the rest of his abdomen and pelvis.  I discussed this with him and we will plan a CAT scan

## 2014-02-05 ENCOUNTER — Telehealth (INDEPENDENT_AMBULATORY_CARE_PROVIDER_SITE_OTHER): Payer: Self-pay

## 2014-02-05 NOTE — Telephone Encounter (Signed)
Pt had a CAT scan of the chest which Scan approximately 1/3 down into the abdomen. He is going 5/21 for a CT of his lower abdomen. Pt states that he has been having left side aches. Pt states that he has not had any other symptoms other than the side aches. He just wanted to make Dr Ninfa Linden aware incase he needed to redo the CT of the upper half again to see what was going on. Pt states that he has not been having to take any meds for these side aches at this time. Advised pt if needed he could take  tynelol or Ibuprofen as needed for the pain. He could also apply heat or ice to the area of pain. Pt states that he came into the office and showed someone where he was having pain, but he was not sure he had spoken with.

## 2014-02-11 ENCOUNTER — Ambulatory Visit
Admission: RE | Admit: 2014-02-11 | Discharge: 2014-02-11 | Disposition: A | Discharge status: Home / Self Care | Payer: BC Managed Care – PPO | Source: Ambulatory Visit | Attending: Surgery | Admitting: Surgery

## 2014-02-11 DIAGNOSIS — C493 Malignant neoplasm of connective and soft tissue of thorax: Secondary | ICD-10-CM

## 2014-02-11 MED ORDER — IOHEXOL 300 MG/ML  SOLN
125.0000 mL | Freq: Once | INTRAMUSCULAR | Status: AC | PRN
  Administered 2014-02-11: 125 mL via INTRAVENOUS

## 2014-02-12 ENCOUNTER — Encounter (INDEPENDENT_AMBULATORY_CARE_PROVIDER_SITE_OTHER): Payer: Self-pay | Admitting: Surgery

## 2014-02-12 ENCOUNTER — Other Ambulatory Visit (INDEPENDENT_AMBULATORY_CARE_PROVIDER_SITE_OTHER): Payer: Self-pay | Admitting: Surgery

## 2014-02-12 ENCOUNTER — Ambulatory Visit (INDEPENDENT_AMBULATORY_CARE_PROVIDER_SITE_OTHER): Payer: BC Managed Care – PPO | Admitting: Surgery

## 2014-02-12 VITALS — BP 130/78 | HR 56 | Temp 97.2°F | Ht 67.0 in | Wt 218.0 lb

## 2014-02-12 DIAGNOSIS — C493 Malignant neoplasm of connective and soft tissue of thorax: Secondary | ICD-10-CM

## 2014-02-12 DIAGNOSIS — Z09 Encounter for follow-up examination after completed treatment for conditions other than malignant neoplasm: Secondary | ICD-10-CM

## 2014-02-12 NOTE — Progress Notes (Signed)
Subjective:     Patient ID: Melvin Montoya, male   DOB: October 22, 1950, 63 y.o.   MRN: [id number]  HPI He is here for his first post op visit s/p excision of a left upper chest leiomyosarcoma.  He has no complaints  Review of Systems     Objective:   Physical Exam His incision is well healed.  He does have an underlying seroma.  I needle aspirated 20 cc of fluid from the cavity    Assessment:     Left chest leiomyosarcoma      Plan:     His CT of the Chest, Abdomen, and Pelvis is negative so hopefully this represents his primary site and not metastatic disease.  His margins were negative.  The only close margin was posterior as expected, but I removed the underlying pec fascia and muscle. I will refer him to oncology to see if there is any need for radiation post op.  I will see him back in 3 weeks to see if seroma needs to be drained again.

## 2014-02-16 NOTE — Progress Notes (Signed)
Thoracic Location of Tumor / Histology:  Patient presented   Biopsies of  (if applicable) revealed:   Tobacco/Marijuana/Snuff/ETOH use:  Past/Anticipated interventions by cardiothoracic surgery, if any:   Past/Anticipated interventions by medical oncology, if any  Signs/Symptoms  Weight changes, if any  Respiratory complaints, if any:  Hemoptysis, if any:  Pain issues, if any:  SAFETY ISSUES:  Prior radiation?No  Pacemaker/ICD? No  Is the patient on methotrexate? No  Current Complaints / other details:Chest dis conmfort tleft upper at times

## 2014-02-17 ENCOUNTER — Encounter: Payer: Self-pay | Admitting: Radiation Oncology

## 2014-02-17 ENCOUNTER — Ambulatory Visit
Admission: RE | Admit: 2014-02-17 | Discharge: 2014-02-17 | Disposition: A | Discharge status: Home / Self Care | Payer: BC Managed Care – PPO | Source: Ambulatory Visit | Attending: Radiation Oncology | Admitting: Radiation Oncology

## 2014-02-17 VITALS — BP 122/69 | HR 64 | Temp 98.7°F | Resp 20 | Ht 67.0 in | Wt 215.9 lb

## 2014-02-17 DIAGNOSIS — N400 Enlarged prostate without lower urinary tract symptoms: Secondary | ICD-10-CM | POA: Diagnosis not present

## 2014-02-17 DIAGNOSIS — C50929 Malignant neoplasm of unspecified site of unspecified male breast: Secondary | ICD-10-CM | POA: Insufficient documentation

## 2014-02-17 DIAGNOSIS — C493 Malignant neoplasm of connective and soft tissue of thorax: Secondary | ICD-10-CM

## 2014-02-17 DIAGNOSIS — E119 Type 2 diabetes mellitus without complications: Secondary | ICD-10-CM | POA: Diagnosis not present

## 2014-02-17 DIAGNOSIS — Z7982 Long term (current) use of aspirin: Secondary | ICD-10-CM | POA: Insufficient documentation

## 2014-02-17 DIAGNOSIS — J309 Allergic rhinitis, unspecified: Secondary | ICD-10-CM | POA: Diagnosis not present

## 2014-02-17 DIAGNOSIS — Z510 Encounter for antineoplastic radiation therapy: Secondary | ICD-10-CM | POA: Insufficient documentation

## 2014-02-17 HISTORY — DX: Unspecified cataract: H26.9

## 2014-02-17 HISTORY — DX: Anxiety disorder, unspecified: F41.9

## 2014-02-17 NOTE — Progress Notes (Signed)
Please see the Nurse Progress Note in the MD Initial Consult Encounter for this patient. 

## 2014-02-17 NOTE — Progress Notes (Signed)
63 year old male. Married. Referred by Dr. Coralie Keens to determine if post op radiation therapy is warranted following wide excision of left upper chest leiomyosarcoma. Patient presented to his PCP the end of April after feeling a painless mass in his left upper chest. Also, reported left shoulder discomfort and mild cough. Denied weight changes.   Margins negative following wide excision of 2 cm chest wall mass on the left side at 11 to 12 position approximately 12 cm from nipple. However, 20 cc underlying sarcoma did develop but, was drained on 02/12/2014 by Dr. Rush Farmer. Dr. Rush Farmer plans to follow up with the patient in 3 weeks to see if seroma needs to be drained again.   NKDA No hx of radiation therapy No indication of a pacemaker

## 2014-02-18 ENCOUNTER — Telehealth (INDEPENDENT_AMBULATORY_CARE_PROVIDER_SITE_OTHER): Payer: Self-pay | Admitting: Surgery

## 2014-02-18 ENCOUNTER — Other Ambulatory Visit (INDEPENDENT_AMBULATORY_CARE_PROVIDER_SITE_OTHER): Payer: Self-pay | Admitting: Surgery

## 2014-02-18 NOTE — Telephone Encounter (Signed)
After a discussion with radiation oncology, it has been recommended that for the best chance at cure and to avoid the need for radiation, the sarcoma resection site of the left chest should be re-excised as the margin was close. I discussed this with Melvin Montoya who agrees to proceed.

## 2014-02-18 NOTE — Progress Notes (Signed)
Radiation Oncology         [phone]) [phone] ________________________________  Initial outpatient Consultation - Date: 02/17/2014   Name: Melvin Montoya MRN: [id number]   DOB: 07-26-1951  REFERRING PHYSICIAN: Harl Bowie, MD  DIAGNOSIS: Stage Ia leiomyosarcoma (close margins)  HISTORY OF PRESENT ILLNESS::Melvin Montoya is a 63 y.o. male  Palpated a left chest wall mass. He brought it to the attention of his PCP who ordered mammograms and ultrasound.  A biopsy was performed on 4/120 which showed a spindle cell proliferation with cytologic atypia. A CT scan showed a 16 x 15 mm mass in the upper left chest wall anterior to the left pectoralis major muscle. No metastatic disease to the chest was noted. He went in for resection of this mass on 01/28/14 by Dr. Ninfa Linden.  A 6 cm specimen was removed which showed a 2 x 1.7 x 1.6 cm mass with grossly negative margins. (the posterior margin was close but muscle and fascia were taken beyond this). 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 He has healed up well since surgery. He had a fluid collection aspirated last week. He was sent to me for consideration of radiation in the management of his disease. He has no history of radiation exposure to the chest. He has not inherited family disease that he knows of.   PREVIOUS RADIATION THERAPY: No  PAST MEDICAL HISTORY:  has a past medical history of Arthritis; Environmental allergies; Enlarged prostate; Pinched nerve in neck; Chicken pox; Diabetes mellitus; Leiomyosarcoma of chest wall; Allergy; Anxiety; Cataract; and Chronic kidney disease.    PAST SURGICAL HISTORY: Past Surgical History  Procedure Laterality Date  . Cholecystectomy  1980  . Wrist surgery Left 2005  . Knee arthroscopy Bilateral   . Hernia repair  2956    UMBILICAL   . Total knee arthroplasty  08/20/2011    Procedure: TOTAL KNEE ARTHROPLASTY;  Surgeon: Kerin Salen;  Location: [facility];  Service: Orthopedics;   Laterality: Left;  . Appendectomy      thinks he had it taken out with his gallbladder  . Tonsillectomy    . Colonoscopy    . Vasectomy    . Total knee arthroplasty Right 10//22/2014  . Total knee arthroplasty Right 07/15/2013    Procedure: TOTAL KNEE ARTHROPLASTY;  Surgeon: Kerin Salen, MD;  Location: [place];  Service: Orthopedics;  Laterality: Right;  . Joint replacement Bilateral     knee  . Mass excision Left 01/28/2014    Procedure: EXCISION LEFT CHEST WALL  MASS;  Surgeon: Harl Bowie, MD;  Location: [place];  Service: General;  Laterality: Left;    FAMILY HISTORY:  Family History  Problem Relation Age of Onset  . Heart disease Mother   . Heart disease Father     SOCIAL HISTORY:  History  Substance Use Topics  . Smoking status: Never Smoker   . Smokeless tobacco: Never Used  . Alcohol Use: Yes     Comment: very occasional    ALLERGIES: Review of patient's allergies indicates no known allergies.  MEDICATIONS:  Current Outpatient Prescriptions  Medication Sig Dispense Refill  . aspirin EC 81 MG tablet Take 81 mg by mouth daily.      Marland Kitchen ezetimibe-simvastatin (VYTORIN) 10-20 MG per tablet Take 1 tablet by mouth every morning.  90 tablet  3  . finasteride (PROSCAR) 5 MG tablet Take 1 tablet (5 mg total) by mouth every evening.  90 tablet  3  .  glucose blood (ONETOUCH VERIO) test strip Once daily before breakfast DX: 250.00  100 each  3  . ibuprofen (ADVIL,MOTRIN) 200 MG tablet Take 400 mg by mouth every 6 (six) hours as needed for pain.      Marland Kitchen lisinopril (PRINIVIL,ZESTRIL) 20 MG tablet Take 1 tablet (20 mg total) by mouth at bedtime.  90 tablet  3  . meloxicam (MOBIC) 15 MG tablet Take 15 mg by mouth daily as needed for pain.       . Multiple Vitamins-Minerals (MULTIVITAMINS THER. W/MINERALS) TABS Take 1 tablet by mouth at bedtime.        Marland Kitchen omega-3 acid ethyl esters (LOVAZA) 1 G capsule Take 1 g by mouth 2 (two) times daily.       Glory Rosebush DELICA LANCETS [id number] MISC  Once daily before breakfast DX: 250.00  100 each  3  . oxyCODONE-acetaminophen (ROXICET) 5-325 MG per tablet Take 1-2 tablets by mouth every 4 (four) hours as needed for severe pain.  40 tablet  0  . pioglitazone (ACTOS) 30 MG tablet Take 1 tablet (30 mg total) by mouth at bedtime.  90 tablet  3  . tamsulosin (FLOMAX) 0.4 MG CAPS capsule Take 1 capsule (0.4 mg total) by mouth at bedtime.  90 capsule  3  . amoxicillin (AMOXIL) 500 MG capsule Take 4 tablets before going to appointment, when only going to the dentist.      . methocarbamol (ROBAXIN) 500 MG tablet Take 1 tablet (500 mg total) by mouth 2 (two) times daily with a meal.  60 tablet  0  . mometasone (NASONEX) 50 MCG/ACT nasal spray Place 2 sprays into the nose every evening.        No current facility-administered medications for this encounter.    REVIEW OF SYSTEMS:  A 15 point review of systems is documented in the electronic medical record. This was obtained by the nursing staff. However, I reviewed this with the patient to discuss relevant findings and make appropriate changes.  Pertinent items are noted in HPI.  PHYSICAL EXAM:  Filed Vitals:   02/17/14 1604  BP: 122/69  Pulse: 64  Temp: 98.7 F (37.1 C)  Resp: 20  .215 lb 14.4 oz (97.932 kg). Pleasant male. Alert and oriented. Surgical incision over th left chest wall below the clavicle. No signs of infection. Alert and oriented. X 3.   LABORATORY DATA:  Lab Results  Component Value Date   WBC 6.1 01/22/2014   HGB 14.1 01/22/2014   HCT 42.0 01/22/2014   MCV 81.1 01/22/2014   PLT 167 01/22/2014   Lab Results  Component Value Date   NA 142 01/22/2014   K 4.4 01/22/2014   CL 104 01/22/2014   CO2 25 01/22/2014   Lab Results  Component Value Date   ALT 17 09/29/2013   AST 20 09/29/2013   ALKPHOS 56 09/29/2013   BILITOT 0.6 09/29/2013     RADIOGRAPHY: Ct Chest W Contrast  01/20/2014   CLINICAL DATA:  Left-sided chest pain.  Known left breast mass.  EXAM: CT CHEST WITH CONTRAST  TECHNIQUE:  Multidetector CT imaging of the chest was performed during intravenous contrast administration.  CONTRAST:  49mL OMNIPAQUE IOHEXOL 300 MG/ML  SOLN  COMPARISON:  None.  FINDINGS: The chest wall demonstrates a irregular enhancing soft tissue mass measuring approximately 16 x 15 mm and located just anterior to the left pectoralis major muscle. Could not exclude involvement of the Specter LS muscle. No axillary or supraclavicular adenopathy.  The thyroid gland appears normal. The bony thorax is intact. No destructive bone lesions or spinal canal compromise.  The heart is normal in size. No pericardial effusion. No mediastinal or hilar mass or adenopathy. The aorta is normal in caliber. The esophagus is normal.  Examination of the lung parenchyma demonstrates no acute pulmonary findings. No worrisome pulmonary nodules or masses. No pleural effusion or pleural nodules.  The upper abdomen is unremarkable. There are surgical changes related to a cholecystectomy. Small low-attenuation lesion in the right hepatic lobe measures 15 Hounsfield units and is likely a benign cyst. Bilateral parapelvic renal cysts are noted. No upper abdominal lymphadenopathy.  IMPRESSION: 1. 16 x 15 mm Soft tissue mass involving the left chest wall located along the anterior margin of the left pectoralis major muscle. 2. No supraclavicular or axillary adenopathy. 3. No findings for metastatic disease involving the chest.   Electronically Signed   By: Kalman Jewels M.D.   On: 01/20/2014 16:24   Ct Abdomen Pelvis W Contrast  02/11/2014   CLINICAL DATA:  Abdominal pain. History of a small subcutaneous sarcoma removed from the left chest.  EXAM: CT ABDOMEN AND PELVIS WITH CONTRAST  TECHNIQUE: Multidetector CT imaging of the abdomen and pelvis was performed using the standard protocol following bolus administration of intravenous contrast.  CONTRAST:  116mL OMNIPAQUE IOHEXOL 300 MG/ML  SOLN  COMPARISON:  None.  FINDINGS: The lung bases are clear.  No pleural effusion or worrisome pulmonary nodules. The heart is normal in size. No pericardial effusion. Mild distal esophageal wall thickening could be due to reflux esophagitis.  The liver is unremarkable. No focal hepatic lesions or intrahepatic biliary dilatation. A small stable liver cyst is noted in the right hepatic lobe. The hepatic veins are on opacified.  The spleen is normal in size. No focal splenic lesions. The gallbladder is surgically absent. No common bile duct dilatation. The pancreas is normal. The adrenal glands and kidneys are unremarkable. Multiple bilateral parapelvic renal cysts are noted.  The stomach, duodenum, small bowel and colon are unremarkable. There is a large amount of stool throughout the colon suggesting constipation. No inflammatory changes, mass lesions or obstructive findings. No mesenteric or retroperitoneal mass or adenopathy. Small scattered lymph nodes are noted. The aorta and branch vessels are patent. The major venous structures are patent.  The bladder, prostate gland and seminal vesicles are unremarkable. No pelvic mass, adenopathy or free pelvic fluid collections. No inguinal mass or hernia.  No subcutaneous lesions are identified. The abdominal wall musculature appears normal. No worrisome mass lesions or hematoma.  The bony structures are unremarkable. Moderate facet degenerative changes are noted in the lumbar spine but no pars defects. No worrisome bone lesions.  IMPRESSION: Unremarkable abdominal/ pelvic CT scan. No acute abdominal/ pelvic findings, mass lesions or adenopathy.  Moderate stool throughout the colon may suggest constipation.  Numerous bilateral parapelvic renal cysts.  No subcutaneous or abdominal wall lesions.   Electronically Signed   By: Kalman Jewels M.D.   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  We discussed the low incidence of chest wall sarcomas and the retrospective evidence directing their treatment.  There really isn't a "compartment" per se as we would consider in extremity sarcomas but I do feel he would benefit from a wider excision. Given the size, grade and location, if 1 cm  or greater margins could be obtained, he would be finished with treatment at that point and not require post operative radiation. After discussion, he agreed this was the best plan.  I also presented him at our multi-disciplinary tumor board (thoracic) and this was the recommendation as well. I have spoken to Dr. Ninfa Linden who left clips in this area and he will bring the patient back in for re-excision. I would be happy to see the patient back prn.  I spent 60 minutes  face to face with the patient and more than 50% of that time was spent in counseling and/or coordination of care.   ------------------------------------------------  Thea Silversmith, MD

## 2014-03-01 ENCOUNTER — Encounter (INDEPENDENT_AMBULATORY_CARE_PROVIDER_SITE_OTHER): Payer: Self-pay | Admitting: Surgery

## 2014-03-01 ENCOUNTER — Ambulatory Visit (INDEPENDENT_AMBULATORY_CARE_PROVIDER_SITE_OTHER): Payer: BC Managed Care – PPO | Admitting: Surgery

## 2014-03-01 VITALS — BP 122/71 | HR 66 | Temp 97.9°F | Resp 14 | Ht 67.5 in | Wt 213.0 lb

## 2014-03-01 DIAGNOSIS — Z09 Encounter for follow-up examination after completed treatment for conditions other than malignant neoplasm: Secondary | ICD-10-CM

## 2014-03-01 NOTE — Progress Notes (Signed)
Subjective:     Patient ID: Melvin Montoya, male   DOB: June 23, 1951, 63 y.o.   MRN: [id number]  HPI He is status post resection of a left chest wall sarcoma. He has since seen radiation oncology. They're recommending reexcision of the resection site. The margins were negative but close.  Review of Systems     Objective:   Physical Exam On exam, his incision is well healed.  Again, the final pathology showed the sarcoma with negative margins but one was close. His complete workup has shown no other evidence of disease in his chest abdomen or pelvis    Assessment:     Patient stable postop     Plan:     After a discussion with oncology, the decision has been made to proceed with reexcision of the biopsy site to achieve larger negative margins. Once this is accomplished, he should not need any radiation at all. Surgery was scheduled

## 2014-03-08 ENCOUNTER — Other Ambulatory Visit (INDEPENDENT_AMBULATORY_CARE_PROVIDER_SITE_OTHER): Payer: Self-pay

## 2014-03-08 ENCOUNTER — Other Ambulatory Visit (INDEPENDENT_AMBULATORY_CARE_PROVIDER_SITE_OTHER): Payer: Self-pay | Admitting: Surgery

## 2014-03-08 DIAGNOSIS — C493 Malignant neoplasm of connective and soft tissue of thorax: Secondary | ICD-10-CM

## 2014-03-08 MED ORDER — OXYCODONE-ACETAMINOPHEN 5-325 MG PO TABS: 1.0000 | ORAL_TABLET | ORAL | Status: DC | PRN

## 2014-03-22 ENCOUNTER — Encounter (INDEPENDENT_AMBULATORY_CARE_PROVIDER_SITE_OTHER): Payer: Self-pay | Admitting: Surgery

## 2014-03-22 ENCOUNTER — Ambulatory Visit (INDEPENDENT_AMBULATORY_CARE_PROVIDER_SITE_OTHER): Payer: BC Managed Care – PPO | Admitting: Surgery

## 2014-03-22 VITALS — BP 133/86 | HR 76 | Temp 98.2°F | Resp 16 | Ht 67.5 in | Wt 206.7 lb

## 2014-03-22 DIAGNOSIS — Z09 Encounter for follow-up examination after completed treatment for conditions other than malignant neoplasm: Secondary | ICD-10-CM

## 2014-03-22 NOTE — Progress Notes (Signed)
Subjective:     Patient ID: Melvin Montoya, male   DOB: Feb 13, 1951, 63 y.o.   MRN: [id number]  HPI He is here for a postoperative visit status post reexcision of the left chest wall sarcoma. Again this is to get further negative margins. He is doing well and has no complaints.  Review of Systems     Objective:   Physical Exam On exam, the incision is healing well. There is an asymptomatic seroma underneath the incision. Again, the final pathology showed no residual sarcoma as expected    Assessment:     Patient stable postop     Plan:     He may resume his normal activity. I will see him back in one month to evaluate the incision to make sure the seroma does not needing any aspiration

## 2014-04-05 ENCOUNTER — Encounter: Payer: BC Managed Care – PPO | Admitting: Family Medicine

## 2014-04-06 ENCOUNTER — Other Ambulatory Visit (INDEPENDENT_AMBULATORY_CARE_PROVIDER_SITE_OTHER): Payer: BC Managed Care – PPO

## 2014-04-06 DIAGNOSIS — Z0000 Encounter for general adult medical examination without abnormal findings: Secondary | ICD-10-CM

## 2014-04-06 LAB — POCT URINALYSIS DIPSTICK
Bilirubin, UA: NEGATIVE
Blood, UA: NEGATIVE
GLUCOSE UA: NEGATIVE
KETONES UA: NEGATIVE
Leukocytes, UA: NEGATIVE
Nitrite, UA: NEGATIVE
PH UA: 5.5
Protein, UA: NEGATIVE
Spec Grav, UA: 1.02
Urobilinogen, UA: 0.2

## 2014-04-06 LAB — CBC WITH DIFFERENTIAL/PLATELET
Basophils Absolute: 0 10*3/uL (ref 0.0–0.1)
Basophils Relative: 0.4 % (ref 0.0–3.0)
EOS ABS: 0.4 10*3/uL (ref 0.0–0.7)
Eosinophils Relative: 10.4 % — ABNORMAL HIGH (ref 0.0–5.0)
HCT: 40.1 % (ref 39.0–52.0)
Hemoglobin: 13.1 g/dL (ref 13.0–17.0)
LYMPHS PCT: 35.5 % (ref 12.0–46.0)
Lymphs Abs: 1.5 10*3/uL (ref 0.7–4.0)
MCHC: 32.7 g/dL (ref 30.0–36.0)
MCV: 84 fl (ref 78.0–100.0)
Monocytes Absolute: 0.4 10*3/uL (ref 0.1–1.0)
Monocytes Relative: 9.2 % (ref 3.0–12.0)
NEUTROS PCT: 44.5 % (ref 43.0–77.0)
Neutro Abs: 1.9 10*3/uL (ref 1.4–7.7)
PLATELETS: 132 10*3/uL — AB (ref 150.0–400.0)
RBC: 4.77 Mil/uL (ref 4.22–5.81)
RDW: 15.1 % (ref 11.5–15.5)
WBC: 4.3 10*3/uL (ref 4.0–10.5)

## 2014-04-06 LAB — MICROALBUMIN / CREATININE URINE RATIO
Creatinine,U: 194.3 mg/dL
MICROALB UR: 0.6 mg/dL (ref 0.0–1.9)
Microalb Creat Ratio: 0.3 mg/g (ref 0.0–30.0)

## 2014-04-06 LAB — LIPID PANEL
Cholesterol: 100 mg/dL (ref 0–200)
HDL: 47.2 mg/dL (ref >39.00)
LDL CALC: 45 mg/dL (ref 0–99)
NonHDL: 52.8
Total CHOL/HDL Ratio: 2
Triglycerides: 39 mg/dL (ref 0.0–149.0)
VLDL: 7.8 mg/dL (ref 0.0–40.0)

## 2014-04-06 LAB — BASIC METABOLIC PANEL
BUN: 23 mg/dL (ref 6–23)
CALCIUM: 9.2 mg/dL (ref 8.4–10.5)
CO2: 29 meq/L (ref 19–32)
CREATININE: 1.2 mg/dL (ref 0.4–1.5)
Chloride: 108 mEq/L (ref 96–112)
GFR: 64.06 mL/min (ref >60.00)
GLUCOSE: 83 mg/dL (ref 70–99)
Potassium: 5.1 mEq/L (ref 3.5–5.1)
Sodium: 141 mEq/L (ref 135–145)

## 2014-04-06 LAB — HEPATIC FUNCTION PANEL
ALK PHOS: 39 U/L (ref 39–117)
ALT: 14 U/L (ref 0–53)
AST: 18 U/L (ref 0–37)
Albumin: 3.5 g/dL (ref 3.5–5.2)
BILIRUBIN DIRECT: 0.2 mg/dL (ref 0.0–0.3)
BILIRUBIN TOTAL: 0.8 mg/dL (ref 0.2–1.2)
Total Protein: 5.6 g/dL — ABNORMAL LOW (ref 6.0–8.3)

## 2014-04-06 LAB — PSA: PSA: 0.53 ng/mL (ref 0.10–4.00)

## 2014-04-06 LAB — HEMOGLOBIN A1C: Hgb A1c MFr Bld: 5.7 % (ref 4.6–6.5)

## 2014-04-06 LAB — TSH: TSH: 1.21 u[IU]/mL (ref 0.35–4.50)

## 2014-04-12 ENCOUNTER — Ambulatory Visit (INDEPENDENT_AMBULATORY_CARE_PROVIDER_SITE_OTHER): Payer: BC Managed Care – PPO | Admitting: Family Medicine

## 2014-04-12 ENCOUNTER — Encounter: Payer: Self-pay | Admitting: Family Medicine

## 2014-04-12 VITALS — BP 122/68 | HR 60 | Temp 98.6°F | Ht 67.5 in | Wt 206.0 lb

## 2014-04-12 DIAGNOSIS — Z0000 Encounter for general adult medical examination without abnormal findings: Secondary | ICD-10-CM

## 2014-04-12 MED ORDER — SIMVASTATIN 20 MG PO TABS: 20.0000 mg | ORAL_TABLET | Freq: Every day | ORAL | Status: DC

## 2014-04-12 MED ORDER — PIOGLITAZONE HCL 15 MG PO TABS: 15.0000 mg | ORAL_TABLET | Freq: Every day | ORAL | Status: DC

## 2014-04-12 NOTE — Progress Notes (Signed)
Pre visit review using our clinic review tool, if applicable. No additional management support is needed unless otherwise documented below in the visit note. 

## 2014-04-12 NOTE — Progress Notes (Signed)
Subjective:    Patient ID: Melvin Montoya, male    DOB: 1951-03-15, 63 y.o.   MRN: [id number]  HPI  Patient here for complete physical. He had left chest wall sarcoma diagnosed several months ago and has undergone surgical excision. He has made tremendous lifestyle changes during the past year. Lost about 60 pounds due to his efforts. Has good appetite. He has type 2 diabetes which has been very well controlled. Remains on Actos. Is not clear whether he was ever treated with metformin. He takes Vytorin for hyperlipidemia.  Past Medical History  Diagnosis Date  . Arthritis   . Environmental allergies   . Enlarged prostate   . Pinched nerve in neck   . Chicken pox   . Diabetes mellitus     fasting blood glucose 110s  . Leiomyosarcoma of chest wall   . Allergy     seasonal   . Anxiety     new dx  . Cataract     no surgery as yet  . Chronic kidney disease     PT ON LISINOPRIL WATCHING FUNCT   Past Surgical History  Procedure Laterality Date  . Cholecystectomy  1980  . Wrist surgery Left 2005  . Knee arthroscopy Bilateral   . Hernia repair  2025    UMBILICAL   . Total knee arthroplasty  08/20/2011    Procedure: TOTAL KNEE ARTHROPLASTY;  Surgeon: Kerin Salen;  Location: [facility];  Service: Orthopedics;  Laterality: Left;  . Appendectomy      thinks he had it taken out with his gallbladder  . Tonsillectomy    . Colonoscopy    . Vasectomy    . Total knee arthroplasty Right 10//22/2014  . Total knee arthroplasty Right 07/15/2013    Procedure: TOTAL KNEE ARTHROPLASTY;  Surgeon: Kerin Salen, MD;  Location: [place] [place];  Service: Orthopedics;  Laterality: Right;  . Joint replacement Bilateral     knee  . Mass excision Left 01/28/2014    Procedure: EXCISION LEFT CHEST WALL  MASS;  Surgeon: Harl Bowie, MD;  Location: [place];  Service: General;  Laterality: Left;    reports that he has never smoked. He has never used smokeless tobacco. He reports that he drinks alcohol. He  reports that he does not use illicit drugs. family history includes Heart disease in his father and mother. No Known Allergies    Review of Systems  Constitutional: Negative for fever, activity change, appetite change and fatigue.  HENT: Negative for congestion, ear pain and trouble swallowing.   Eyes: Negative for pain and visual disturbance.  Respiratory: Negative for cough, shortness of breath and wheezing.   Cardiovascular: Negative for chest pain and palpitations.  Gastrointestinal: Negative for nausea, vomiting, abdominal pain, diarrhea, constipation, blood in stool, abdominal distention and rectal pain.  Genitourinary: Negative for dysuria, hematuria and testicular pain.  Musculoskeletal: Negative for arthralgias and joint swelling.  Skin: Negative for rash.  Neurological: Negative for dizziness, syncope and headaches.  Hematological: Negative for adenopathy.  Psychiatric/Behavioral: Negative for confusion and dysphoric mood.       Objective:   Physical Exam  Constitutional: He is oriented to person, place, and time. He appears well-developed and well-nourished. No distress.  HENT:  Head: Normocephalic and atraumatic.  Right Ear: External ear normal.  Left Ear: External ear normal.  Mouth/Throat: Oropharynx is clear and moist.  Eyes: Conjunctivae and EOM are normal. Pupils are equal, round, and reactive to light.  Neck: Normal range  of motion. Neck supple. No thyromegaly present.  Cardiovascular: Normal rate, regular rhythm and normal heart sounds.   No murmur heard. Pulmonary/Chest: No respiratory distress. He has no wheezes. He has no rales.  Scar left upper chest wall from recent surgery. He has some fluid collection of the scan typical of probable seroma. No erythema. Nontender. No left axillary adenopathy  Abdominal: Soft. Bowel sounds are normal. He exhibits no distension and no mass. There is no tenderness. There is no rebound and no guarding.  Musculoskeletal: He  exhibits no edema.  Lymphadenopathy:    He has no cervical adenopathy.  Neurological: He is alert and oriented to person, place, and time. He displays normal reflexes. No cranial nerve deficit.  Skin: No rash noted.  Psychiatric: He has a normal mood and affect.          Assessment & Plan:  Complete physical. Continue weight loss and weight control efforts. Liberalize protein intake somewhat. Change Vytorin to simvastatin 20 mg once daily with well controlled lipids. Reduce Actos to 15 mg once daily. Reassess 6 months  We discussed surveillance issues regarding his sarcoma. We'll discuss with his Education officer, environmental.

## 2014-04-12 NOTE — Patient Instructions (Signed)
High Protein Diet  A high protein diet means that high protein foods are added to your diet. Getting more protein in the diet is important for a number of reasons. Protein helps the body to build tissue, muscle, and to repair damage. People who have had surgery, injuries such as broken bones, infections, and burns, or illnesses such as cancer, may need more protein in their diet.   SERVING SIZES  Measuring foods and serving sizes helps to make sure you are getting the right amount of food. The list below tells how big or small some common serving sizes are.   · 1 oz.........4 stacked dice.  · 3 oz.........Deck of cards.  · 1 tsp........Tip of little finger.  · 1 tbs........Thumb.  · 2 tbs........Golf ball.  · ½ cup.......Half of a fist.  · 1 cup........A fist.  FOOD SOURCES OF PROTEIN  Listed below are some food sources of protein and the amount of protein they contain. Your Registered Dietitian can calculate how many grams of protein you need for your medical condition. High protein foods can be added to the diet at mealtime or as snacks. Be sure to have at least 1 protein-containing food at each meal and snack to ensure adequate intake.   Meats and Meat Substitutes / Protein (g)  · 3 oz poultry (chicken, turkey) / 26 g  · 3 oz tuna, canned in water / 26 g  · 3 oz fish (cod) / 21 g  · 3 oz red meat (beef, pork) / 21 g  · 4 oz tofu / 9 g  · 1 egg / 6 g  · ¼ cup egg substitute / 5 g  · 1 cup dried beans / 15 g  · 1 cup soy milk / 4 g  Dairy / Protein (g)  · 1 cup milk (skim, 1%, 2%, whole) / 8 g  · ½ cup evaporated milk / 9 g  · 1 cup buttermilk / 8 g  · 1 cup low-fat plain yogurt / 11 g  · 1 cup regular plain yogurt / 9 g  · ½ cup cottage cheese / 14 g  · 1 oz cheddar cheese / 7 g  Nuts / Protein (g)  · 2 tbs peanut butter / 8 g  · 1 oz peanuts / 7 g  · 2 tbs cashews / 5 g  · 2 tbs almonds / 5 g  Document Released: 09/10/2005 Document Revised: 12/03/2011 Document Reviewed: 06/13/2007  [employer]® Patient Information  ©2015 [employer]. This information is not intended to replace advice given to you by your health care provider. Make sure you discuss any questions you have with your health care provider.

## 2014-04-16 LAB — HM DIABETES EYE EXAM

## 2014-04-19 ENCOUNTER — Ambulatory Visit (INDEPENDENT_AMBULATORY_CARE_PROVIDER_SITE_OTHER): Payer: BC Managed Care – PPO | Admitting: Surgery

## 2014-04-19 ENCOUNTER — Encounter (INDEPENDENT_AMBULATORY_CARE_PROVIDER_SITE_OTHER): Payer: Self-pay | Admitting: Surgery

## 2014-04-19 VITALS — BP 124/85 | HR 70 | Temp 98.1°F | Resp 16 | Ht 67.5 in | Wt 199.0 lb

## 2014-04-19 DIAGNOSIS — Z09 Encounter for follow-up examination after completed treatment for conditions other than malignant neoplasm: Secondary | ICD-10-CM

## 2014-04-19 NOTE — Progress Notes (Signed)
Subjective:     Patient ID: Melvin Montoya, male   DOB: 13-Oct-1950, 63 y.o.   MRN: [id number]  HPI He is here for another postoperative visit status post reexcision of the left chest sarcoma. He reports that the seroma is getting larger it is still fairly asymptomatic.  Review of Systems     Objective:   Physical Exam On exam, there is a large seroma. I did elect to drain approximately 20 cc of the fluid. I did not want to drain at all as last time it left him with an indention.    Assessment:     Patient stable postop     Plan:     I will see him back in one month to evaluate the seroma.  Regarding his long-term followup, I will continue to see him yearly for any evidence of local recurrence of the sarcoma. He currently does not need further followup at the [facility].

## 2014-04-22 ENCOUNTER — Encounter: Payer: Self-pay | Admitting: Family Medicine

## 2014-05-21 ENCOUNTER — Ambulatory Visit (INDEPENDENT_AMBULATORY_CARE_PROVIDER_SITE_OTHER): Payer: BC Managed Care – PPO | Admitting: Surgery

## 2014-05-21 VITALS — BP 124/80 | HR 73 | Temp 98.0°F | Ht 67.0 in | Wt 193.0 lb

## 2014-05-21 DIAGNOSIS — Z09 Encounter for follow-up examination after completed treatment for conditions other than malignant neoplasm: Secondary | ICD-10-CM

## 2014-05-21 NOTE — Progress Notes (Signed)
Subjective:     Patient ID: Melvin Montoya, male   DOB: 20-Aug-1951, 63 y.o.   MRN: [id number]  HPI He is here for another postoperative visit. He is doing well and has no complaints.  Review of Systems     Objective:   Physical Exam The left upper chest incision is well healed. There is no further seroma.    Assessment:     Patient stable postop     Plan:     He will examine himself monthly. I will see him back in one year to reexamine the site of the sarcoma removal.

## 2014-05-29 IMAGING — CT CT ABD-PELV W/ CM
3 of 5 series · 12 of 36 positions shown, 18 images · IV contrast (READICAT/WATER & [ID] OMNI 300)
Comparison: None.

CLINICAL DATA: Abdominal pain. History of a small subcutaneous
sarcoma removed from the left chest.

EXAM:
CT ABDOMEN AND PELVIS WITH CONTRAST
TECHNIQUE: Multidetector CT imaging of the abdomen and pelvis was performed
using the standard protocol following bolus administration of
intravenous contrast.
CONTRAST:  125mL OMNIPAQUE IOHEXOL 300 MG/ML  SOLN

[Series 3: abd/pelvis with · axial · 0.84mm/px · z∈[-374,-44]mm · 7 of 89 slices shown, 12 images]
[im 12/89  soft-tissue]
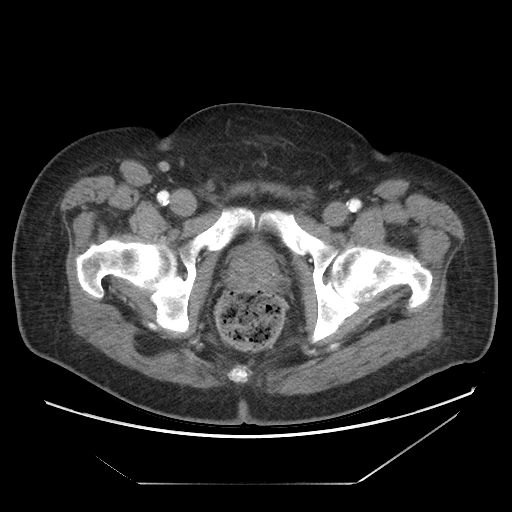
[im 12/89  bone]
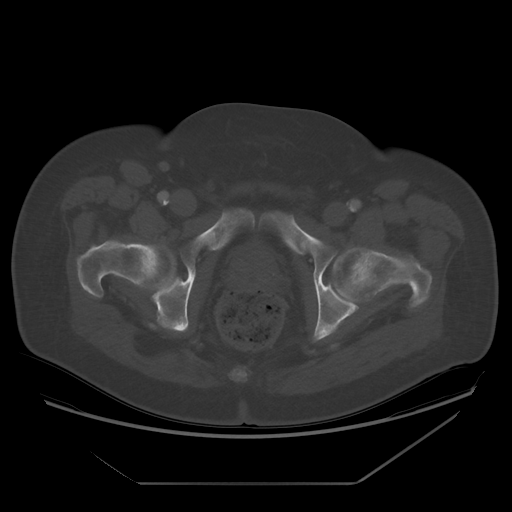
[im 23/89  soft-tissue]
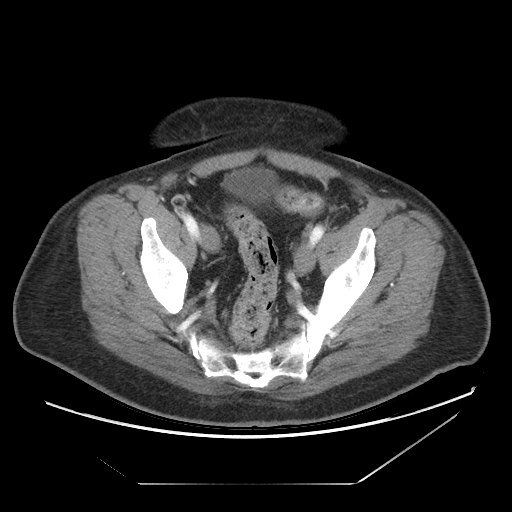
[im 34/89  soft-tissue]
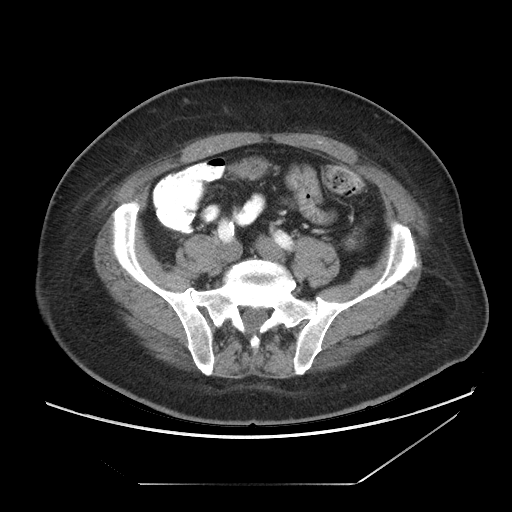
[im 45/89  soft-tissue]
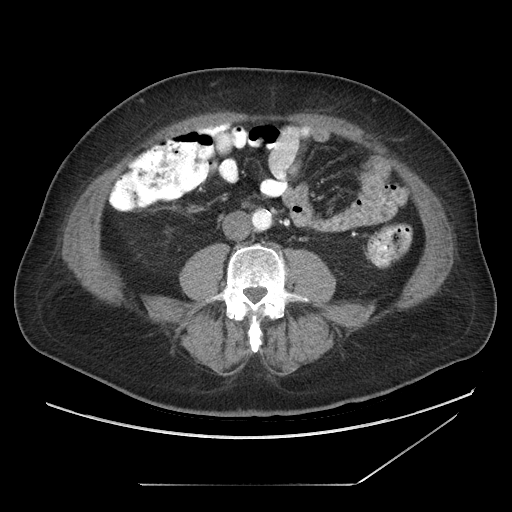
[im 45/89  lung]
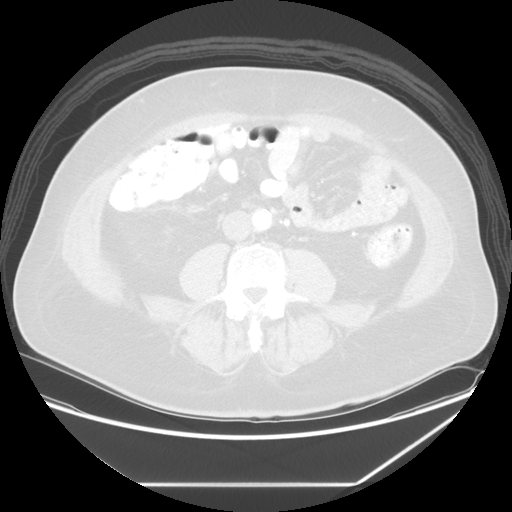
[im 56/89  soft-tissue]
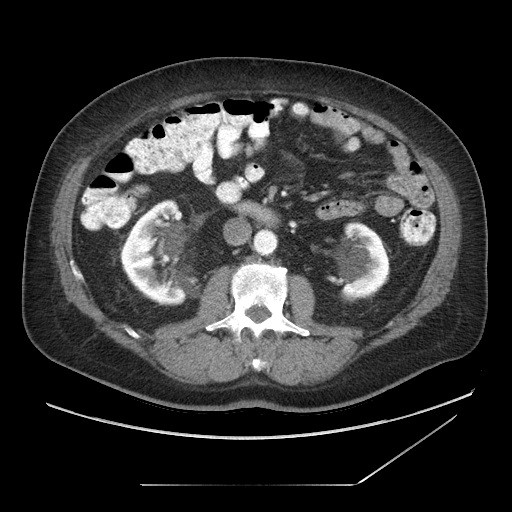
[im 56/89  lung]
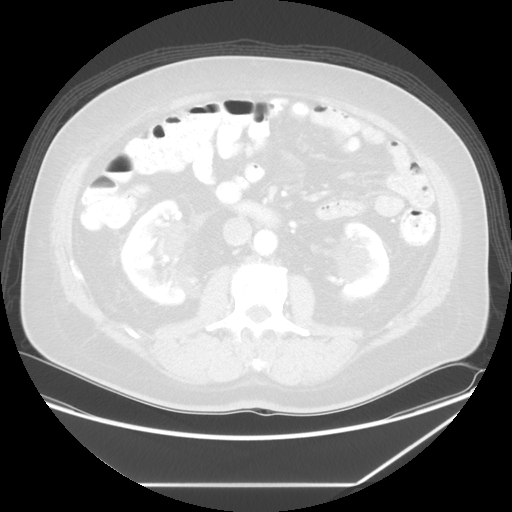
[im 67/89  soft-tissue]
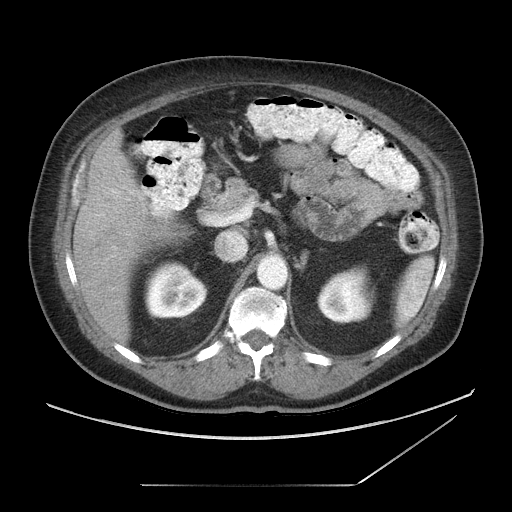
[im 67/89  lung]
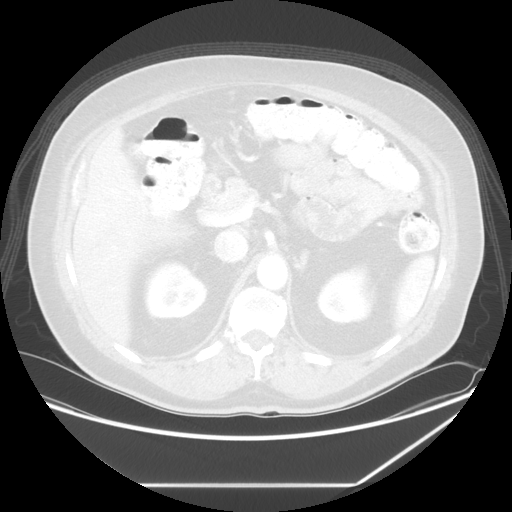
[im 78/89  soft-tissue]
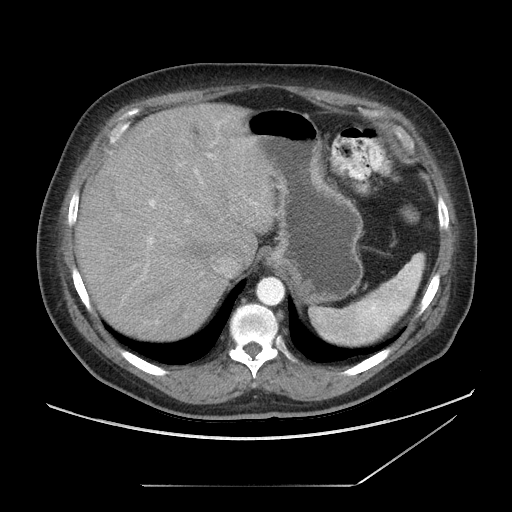
[im 78/89  lung]
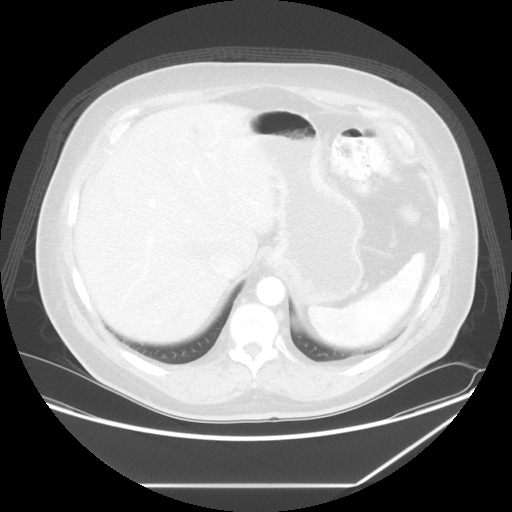

[Series 601: coronal body · coronal · 0.89mm/px · 1 of 128 slices shown, 2 images]
[im 43/128  soft-tissue]
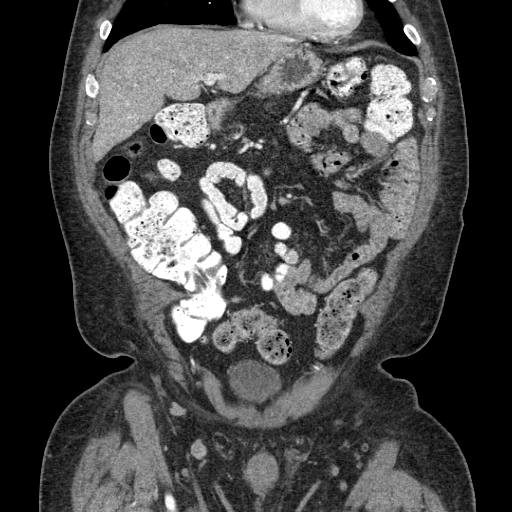
[im 43/128  bone]
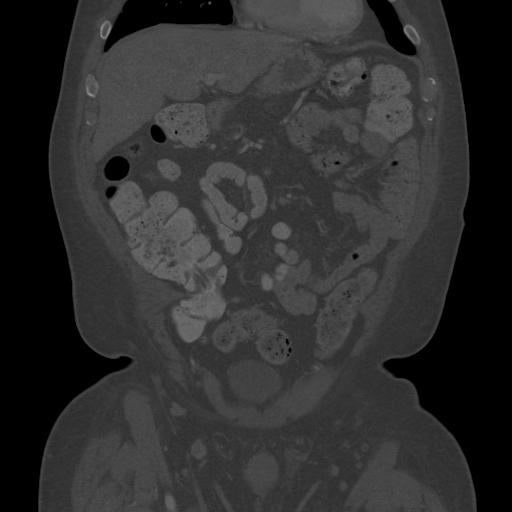

[Series 602: sagittal body · sagittal · 0.89mm/px · 4 of 166 slices shown]
[im 11/166  soft-tissue]
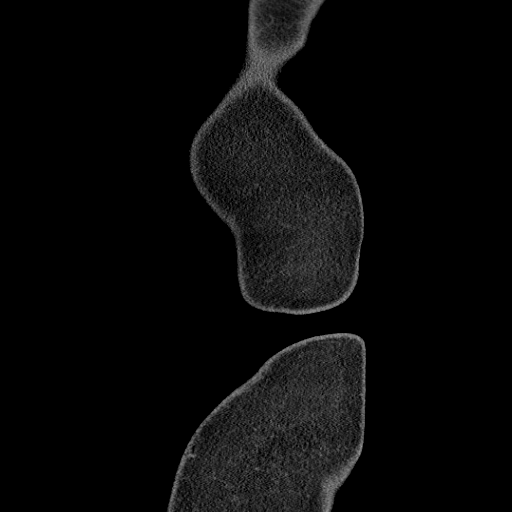
[im 31/166  soft-tissue]
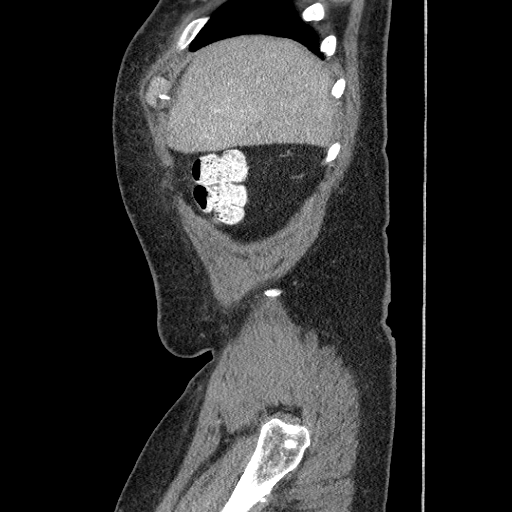
[im 52/166  soft-tissue]
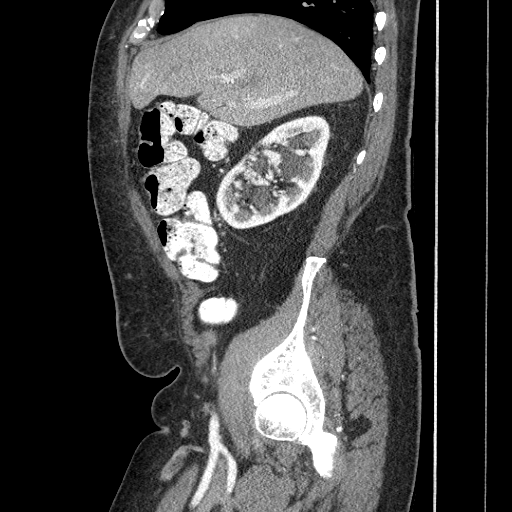
[im 73/166  soft-tissue]
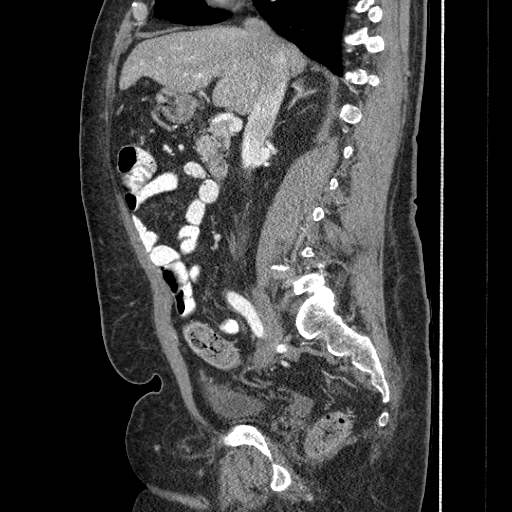

[12 of 36 positions shown; findings below may reference images not displayed]

FINDINGS: The lung bases are clear. No pleural effusion or worrisome pulmonary
nodules. The heart is normal in size. No pericardial effusion. Mild
distal esophageal wall thickening could be due to reflux
esophagitis.

The liver is unremarkable. No focal hepatic lesions or intrahepatic
biliary dilatation. A small stable liver cyst is noted in the right
hepatic lobe. The hepatic veins are on opacified.

The spleen is normal in size. No focal splenic lesions. The
gallbladder is surgically absent. No common bile duct dilatation.
The pancreas is normal. The adrenal glands and kidneys are
unremarkable. Multiple bilateral parapelvic renal cysts are noted.

The stomach, duodenum, small bowel and colon are unremarkable. There
is a large amount of stool throughout the colon suggesting
constipation. No inflammatory changes, mass lesions or obstructive
findings. No mesenteric or retroperitoneal mass or adenopathy. Small
scattered lymph nodes are noted. The aorta and branch vessels are
patent. The major venous structures are patent.

The bladder, prostate gland and seminal vesicles are unremarkable.
No pelvic mass, adenopathy or free pelvic fluid collections. No
inguinal mass or hernia.

No subcutaneous lesions are identified. The abdominal wall
musculature appears normal. No worrisome mass lesions or hematoma.

The bony structures are unremarkable. Moderate facet degenerative
changes are noted in the lumbar spine but no pars defects. No
worrisome bone lesions.
IMPRESSION: Unremarkable abdominal/ pelvic CT scan. No acute abdominal/ pelvic
findings, mass lesions or adenopathy.

Moderate stool throughout the colon may suggest constipation.

Numerous bilateral parapelvic renal cysts.

No subcutaneous or abdominal wall lesions.

## 2014-06-14 ENCOUNTER — Encounter: Payer: Self-pay | Admitting: Family Medicine

## 2014-07-26 ENCOUNTER — Ambulatory Visit (INDEPENDENT_AMBULATORY_CARE_PROVIDER_SITE_OTHER): Payer: BC Managed Care – PPO

## 2014-07-26 ENCOUNTER — Ambulatory Visit: Payer: BC Managed Care – PPO | Admitting: Family Medicine

## 2014-07-26 DIAGNOSIS — Z23 Encounter for immunization: Secondary | ICD-10-CM

## 2014-07-29 ENCOUNTER — Encounter: Payer: BC Managed Care – PPO | Admitting: Family Medicine

## 2014-09-30 ENCOUNTER — Other Ambulatory Visit: Payer: Self-pay | Admitting: Family Medicine

## 2014-10-12 ENCOUNTER — Other Ambulatory Visit: Payer: Self-pay | Admitting: Family Medicine

## 2014-11-03 ENCOUNTER — Other Ambulatory Visit: Payer: Self-pay | Admitting: Family Medicine

## 2014-12-07 ENCOUNTER — Other Ambulatory Visit: Payer: Self-pay | Admitting: Family Medicine

## 2015-03-14 LAB — HM DIABETES EYE EXAM

## 2015-03-22 ENCOUNTER — Other Ambulatory Visit: Payer: Self-pay | Admitting: *Deleted

## 2015-03-22 ENCOUNTER — Other Ambulatory Visit: Payer: Self-pay | Admitting: Surgery

## 2015-03-22 DIAGNOSIS — R222 Localized swelling, mass and lump, trunk: Secondary | ICD-10-CM

## 2015-03-23 ENCOUNTER — Encounter: Payer: Self-pay | Admitting: Family Medicine

## 2015-03-25 ENCOUNTER — Other Ambulatory Visit: Payer: Self-pay | Admitting: Surgery

## 2015-03-25 DIAGNOSIS — S36112A Contusion of liver, initial encounter: Secondary | ICD-10-CM

## 2015-03-29 ENCOUNTER — Other Ambulatory Visit: Payer: Self-pay | Admitting: Family Medicine

## 2015-03-29 ENCOUNTER — Ambulatory Visit
Admission: RE | Admit: 2015-03-29 | Discharge: 2015-03-29 | Disposition: A | Discharge status: Home / Self Care | Payer: BLUE CROSS/BLUE SHIELD | Source: Ambulatory Visit | Attending: Surgery | Admitting: Surgery

## 2015-03-29 ENCOUNTER — Other Ambulatory Visit: Payer: Self-pay | Admitting: Surgery

## 2015-03-29 DIAGNOSIS — R222 Localized swelling, mass and lump, trunk: Secondary | ICD-10-CM

## 2015-03-29 MED ORDER — IOPAMIDOL (ISOVUE-300) INJECTION 61%
75.0000 mL | Freq: Once | INTRAVENOUS | Status: AC | PRN
  Administered 2015-03-29: 100 mL via INTRAVENOUS

## 2015-03-30 ENCOUNTER — Other Ambulatory Visit: Payer: Self-pay | Admitting: Surgery

## 2015-04-19 ENCOUNTER — Other Ambulatory Visit: Payer: Self-pay | Admitting: Family Medicine

## 2015-04-22 ENCOUNTER — Other Ambulatory Visit (INDEPENDENT_AMBULATORY_CARE_PROVIDER_SITE_OTHER): Payer: BLUE CROSS/BLUE SHIELD

## 2015-04-22 ENCOUNTER — Other Ambulatory Visit: Payer: BLUE CROSS/BLUE SHIELD | Admitting: Family Medicine

## 2015-04-22 DIAGNOSIS — Z0000 Encounter for general adult medical examination without abnormal findings: Secondary | ICD-10-CM

## 2015-04-22 LAB — LIPID PANEL
Cholesterol: 142 mg/dL (ref 0–200)
HDL: 59.2 mg/dL (ref >39.00)
LDL Cholesterol: 75 mg/dL (ref 0–99)
NONHDL: 82.86
Total CHOL/HDL Ratio: 2
Triglycerides: 40 mg/dL (ref 0.0–149.0)
VLDL: 8 mg/dL (ref 0.0–40.0)

## 2015-04-22 LAB — CBC WITH DIFFERENTIAL/PLATELET
BASOS ABS: 0 10*3/uL (ref 0.0–0.1)
BASOS PCT: 0.5 % (ref 0.0–3.0)
Eosinophils Absolute: 0.2 10*3/uL (ref 0.0–0.7)
Eosinophils Relative: 4.9 % (ref 0.0–5.0)
HCT: 44.1 % (ref 39.0–52.0)
Hemoglobin: 14.5 g/dL (ref 13.0–17.0)
LYMPHS PCT: 29 % (ref 12.0–46.0)
Lymphs Abs: 1.4 10*3/uL (ref 0.7–4.0)
MCHC: 32.9 g/dL (ref 30.0–36.0)
MCV: 84.4 fl (ref 78.0–100.0)
MONOS PCT: 10.4 % (ref 3.0–12.0)
Monocytes Absolute: 0.5 10*3/uL (ref 0.1–1.0)
Neutro Abs: 2.7 10*3/uL (ref 1.4–7.7)
Neutrophils Relative %: 55.2 % (ref 43.0–77.0)
PLATELETS: 162 10*3/uL (ref 150.0–400.0)
RBC: 5.22 Mil/uL (ref 4.22–5.81)
RDW: 13.9 % (ref 11.5–15.5)
WBC: 4.8 10*3/uL (ref 4.0–10.5)

## 2015-04-22 LAB — BASIC METABOLIC PANEL
BUN: 19 mg/dL (ref 6–23)
CO2: 29 mEq/L (ref 19–32)
CREATININE: 1.2 mg/dL (ref 0.40–1.50)
Calcium: 9.5 mg/dL (ref 8.4–10.5)
Chloride: 107 mEq/L (ref 96–112)
GFR: 64.47 mL/min (ref >60.00)
Glucose, Bld: 100 mg/dL — ABNORMAL HIGH (ref 70–99)
Potassium: 5 mEq/L (ref 3.5–5.1)
Sodium: 141 mEq/L (ref 135–145)

## 2015-04-22 LAB — HEPATIC FUNCTION PANEL
ALK PHOS: 50 U/L (ref 39–117)
ALT: 22 U/L (ref 0–53)
AST: 33 U/L (ref 0–37)
Albumin: 4.2 g/dL (ref 3.5–5.2)
Bilirubin, Direct: 0.2 mg/dL (ref 0.0–0.3)
TOTAL PROTEIN: 6.3 g/dL (ref 6.0–8.3)
Total Bilirubin: 0.6 mg/dL (ref 0.2–1.2)

## 2015-04-22 LAB — MICROALBUMIN / CREATININE URINE RATIO
CREATININE, U: 196.7 mg/dL
MICROALB UR: 0.7 mg/dL (ref 0.0–1.9)
MICROALB/CREAT RATIO: 0.4 mg/g (ref 0.0–30.0)

## 2015-04-22 LAB — PSA: PSA: 0.72 ng/mL (ref 0.10–4.00)

## 2015-04-22 LAB — TSH: TSH: 0.88 u[IU]/mL (ref 0.35–4.50)

## 2015-04-22 LAB — HEMOGLOBIN A1C: Hgb A1c MFr Bld: 5.4 % (ref 4.6–6.5)

## 2015-04-29 ENCOUNTER — Ambulatory Visit (INDEPENDENT_AMBULATORY_CARE_PROVIDER_SITE_OTHER): Payer: BLUE CROSS/BLUE SHIELD | Admitting: Family Medicine

## 2015-04-29 ENCOUNTER — Encounter: Payer: Self-pay | Admitting: Family Medicine

## 2015-04-29 VITALS — BP 122/78 | HR 60 | Temp 97.9°F | Ht 67.0 in | Wt 180.0 lb

## 2015-04-29 DIAGNOSIS — Z23 Encounter for immunization: Secondary | ICD-10-CM | POA: Diagnosis not present

## 2015-04-29 DIAGNOSIS — Z0000 Encounter for general adult medical examination without abnormal findings: Secondary | ICD-10-CM | POA: Diagnosis not present

## 2015-04-29 MED ORDER — TADALAFIL 5 MG PO TABS: 5.0000 mg | ORAL_TABLET | Freq: Every day | ORAL | Status: DC | PRN

## 2015-04-29 MED ORDER — FINASTERIDE 5 MG PO TABS: 5.0000 mg | ORAL_TABLET | Freq: Every evening | ORAL | Status: DC

## 2015-04-29 NOTE — Patient Instructions (Signed)
Stop Actos and continue to monitor blood sugars.  Be in touch if fasting sugars consistently > 130 Continue with yearly flu vaccine

## 2015-04-29 NOTE — Addendum Note (Signed)
Addended by: Marcina Millard on: 04/29/2015 10:18 AM   Modules accepted: Orders

## 2015-04-29 NOTE — Progress Notes (Signed)
Subjective:    Patient ID: Melvin Montoya, male    DOB: Feb 26, 1951, 64 y.o.   MRN: [id number]  HPI Patient for complete physical. 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 His major medical lifestyle changes and overall is lost about 85 pounds. He is now trying to maintain his weight. He has good appetite. He did have a small mobile fatty consistency lump left lower rib cage area recently had CT chest as well as abdomen and pelvis per surgeon which did not show any recurrent cancer. He plans to get this excised in the next month or so. Needs Prevnar 13. Other immunizations up-to-date. Blood sugars extremely well controlled with his weight loss. No hypoglycemia. Remains on Actos 15 mg daily. Consistent fasting blood sugars 90s to about 110.  Complains of erectile dysfunction. History of BPH. Requesting trial of Cialis. No contraindications  Past Medical History  Diagnosis Date  . Arthritis   . Environmental allergies   . Enlarged prostate   . Pinched nerve in neck   . Chicken pox   . Diabetes mellitus     fasting blood glucose 110s  . Leiomyosarcoma of chest wall   . Allergy     seasonal   . Anxiety     new dx  . Cataract     no surgery as yet  . Chronic kidney disease     PT ON LISINOPRIL WATCHING FUNCT   Past Surgical History  Procedure Laterality Date  . Cholecystectomy  1980  . Wrist surgery Left 2005  . Knee arthroscopy Bilateral   . Hernia repair  9563    UMBILICAL   . Total knee arthroplasty  08/20/2011    Procedure: TOTAL KNEE ARTHROPLASTY;  Surgeon: Kerin Salen;  Location: [facility];  Service: Orthopedics;  Laterality: Left;  . Appendectomy      thinks he had it taken out with his gallbladder  . Tonsillectomy    . Colonoscopy    . Vasectomy    . Total knee arthroplasty Right 10//22/2014  . Total knee arthroplasty Right 07/15/2013    Procedure: TOTAL KNEE ARTHROPLASTY;  Surgeon: Kerin Salen, MD;  Location: [facility];   Service: Orthopedics;  Laterality: Right;  . Joint replacement Bilateral     knee  . Mass excision Left 01/28/2014    Procedure: EXCISION LEFT CHEST WALL  MASS;  Surgeon: Harl Bowie, MD;  Location: [facility];  Service: General;  Laterality: Left;    reports that he has never smoked. He has never used smokeless tobacco. He reports that he drinks alcohol. He reports that he does not use illicit drugs. family history includes Heart disease in his father and mother. No Known Allergies    Review of Systems  Constitutional: Negative for fever, activity change, appetite change and fatigue.  HENT: Negative for congestion, ear pain and trouble swallowing.   Eyes: Negative for pain and visual disturbance.  Respiratory: Negative for cough, shortness of breath and wheezing.   Cardiovascular: Negative for chest pain and palpitations.  Gastrointestinal: Negative for nausea, vomiting, abdominal pain, diarrhea, constipation, blood in stool, abdominal distention and rectal pain.  Genitourinary: Negative for dysuria, hematuria and testicular pain.  Musculoskeletal: Negative for joint swelling and arthralgias.  Skin: Negative for rash.  Neurological: Negative for dizziness, syncope and headaches.  Hematological: Negative for adenopathy.  Psychiatric/Behavioral: Negative for confusion and dysphoric mood.       Objective:  Physical Exam  Constitutional: He is oriented to person, place, and time. He appears well-developed and well-nourished. No distress.  HENT:  Head: Normocephalic and atraumatic.  Right Ear: External ear normal.  Left Ear: External ear normal.  Mouth/Throat: Oropharynx is clear and moist.  Eyes: Conjunctivae and EOM are normal. Pupils are equal, round, and reactive to light.  Neck: Normal range of motion. Neck supple. No thyromegaly present.  Cardiovascular: Normal rate, regular rhythm and normal heart sounds.   No murmur heard. Pulmonary/Chest: No respiratory distress. He has  no wheezes. He has no rales.  Abdominal: Soft. Bowel sounds are normal. He exhibits no distension and no mass. There is no tenderness. There is no rebound and no guarding.  Musculoskeletal: He exhibits no edema.  Lymphadenopathy:    He has no cervical adenopathy.  Neurological: He is alert and oriented to person, place, and time. He displays normal reflexes. No cranial nerve deficit.  Skin: No rash noted.  Psychiatric: He has a normal mood and affect.          Assessment & Plan:  Complete physical. Prevnar 13 given. Labs are excellent. A1c 5.4%. Discontinue Actos and monitor blood sugars closely-be in touch if fasting blood sugars consistently greater than 130. Trial of Cialis 5 mg daily. Continue yearly flu vaccine

## 2015-04-29 NOTE — Progress Notes (Signed)
Pre visit review using our clinic review tool, if applicable. No additional management support is needed unless otherwise documented below in the visit note. 

## 2015-05-03 ENCOUNTER — Telehealth: Payer: Self-pay | Admitting: Family Medicine

## 2015-05-03 NOTE — Telephone Encounter (Signed)
Received fax notification from [facility] stating prior authorization for Cialis 5mg  has been denied.  Plan will cover medication only if the patient has tried 2.5mg  once daily.

## 2015-05-04 MED ORDER — TADALAFIL 2.5 MG PO TABS: 2.5000 mg | ORAL_TABLET | Freq: Every day | ORAL | Status: DC | PRN

## 2015-05-04 NOTE — Telephone Encounter (Signed)
Rx  Changed and sent to mail order.

## 2015-05-04 NOTE — Telephone Encounter (Signed)
Let patient know and OK to try the 2.5 mg

## 2015-07-05 ENCOUNTER — Other Ambulatory Visit: Payer: Self-pay | Admitting: Family Medicine

## 2015-07-13 ENCOUNTER — Ambulatory Visit (INDEPENDENT_AMBULATORY_CARE_PROVIDER_SITE_OTHER): Payer: BLUE CROSS/BLUE SHIELD | Admitting: Family Medicine

## 2015-07-13 DIAGNOSIS — Z23 Encounter for immunization: Secondary | ICD-10-CM | POA: Diagnosis not present

## 2015-07-15 ENCOUNTER — Other Ambulatory Visit: Payer: Self-pay | Admitting: Surgery

## 2015-07-28 ENCOUNTER — Other Ambulatory Visit: Payer: Self-pay | Admitting: Family Medicine

## 2015-08-23 ENCOUNTER — Other Ambulatory Visit: Payer: Self-pay | Admitting: Surgery

## 2015-10-18 ENCOUNTER — Other Ambulatory Visit: Payer: Self-pay | Admitting: Family Medicine

## 2015-11-21 ENCOUNTER — Other Ambulatory Visit: Payer: Self-pay | Admitting: Family Medicine

## 2015-12-27 ENCOUNTER — Other Ambulatory Visit: Payer: Self-pay | Admitting: Family Medicine

## 2016-01-23 ENCOUNTER — Other Ambulatory Visit: Payer: Self-pay | Admitting: Family Medicine

## 2016-03-20 LAB — HM DIABETES EYE EXAM

## 2016-03-29 ENCOUNTER — Encounter: Payer: Self-pay | Admitting: Family Medicine

## 2016-04-10 ENCOUNTER — Other Ambulatory Visit: Payer: Self-pay | Admitting: Family Medicine

## 2016-07-03 ENCOUNTER — Other Ambulatory Visit: Payer: Self-pay | Admitting: Family Medicine

## 2016-07-09 ENCOUNTER — Other Ambulatory Visit: Payer: Self-pay | Admitting: Family Medicine

## 2016-07-30 ENCOUNTER — Other Ambulatory Visit: Payer: Self-pay | Admitting: Family Medicine

## 2016-08-01 ENCOUNTER — Other Ambulatory Visit (INDEPENDENT_AMBULATORY_CARE_PROVIDER_SITE_OTHER): Payer: BLUE CROSS/BLUE SHIELD

## 2016-08-01 DIAGNOSIS — Z0000 Encounter for general adult medical examination without abnormal findings: Secondary | ICD-10-CM | POA: Diagnosis not present

## 2016-08-01 LAB — CBC WITH DIFFERENTIAL/PLATELET
BASOS ABS: 0 10*3/uL (ref 0.0–0.1)
Basophils Relative: 0.5 % (ref 0.0–3.0)
EOS ABS: 0.3 10*3/uL (ref 0.0–0.7)
Eosinophils Relative: 5.1 % — ABNORMAL HIGH (ref 0.0–5.0)
HCT: 43.4 % (ref 39.0–52.0)
Hemoglobin: 14.5 g/dL (ref 13.0–17.0)
LYMPHS ABS: 1.4 10*3/uL (ref 0.7–4.0)
Lymphocytes Relative: 23.2 % (ref 12.0–46.0)
MCHC: 33.4 g/dL (ref 30.0–36.0)
MCV: 82.5 fl (ref 78.0–100.0)
MONO ABS: 0.6 10*3/uL (ref 0.1–1.0)
Monocytes Relative: 9.6 % (ref 3.0–12.0)
NEUTROS PCT: 61.6 % (ref 43.0–77.0)
Neutro Abs: 3.7 10*3/uL (ref 1.4–7.7)
Platelets: 168 10*3/uL (ref 150.0–400.0)
RBC: 5.26 Mil/uL (ref 4.22–5.81)
RDW: 13.5 % (ref 11.5–15.5)
WBC: 6 10*3/uL (ref 4.0–10.5)

## 2016-08-01 LAB — TSH: TSH: 1.62 u[IU]/mL (ref 0.35–4.50)

## 2016-08-01 LAB — HEPATIC FUNCTION PANEL
ALK PHOS: 45 U/L (ref 39–117)
ALT: 14 U/L (ref 0–53)
AST: 17 U/L (ref 0–37)
Albumin: 4.1 g/dL (ref 3.5–5.2)
BILIRUBIN TOTAL: 0.6 mg/dL (ref 0.2–1.2)
Bilirubin, Direct: 0.1 mg/dL (ref 0.0–0.3)
Total Protein: 6.3 g/dL (ref 6.0–8.3)

## 2016-08-01 LAB — BASIC METABOLIC PANEL
BUN: 28 mg/dL — AB (ref 6–23)
CALCIUM: 9.3 mg/dL (ref 8.4–10.5)
CO2: 30 mEq/L (ref 19–32)
Chloride: 105 mEq/L (ref 96–112)
Creatinine, Ser: 1.37 mg/dL (ref 0.40–1.50)
GFR: 55.11 mL/min — AB (ref >60.00)
GLUCOSE: 94 mg/dL (ref 70–99)
Potassium: 4.8 mEq/L (ref 3.5–5.1)
Sodium: 140 mEq/L (ref 135–145)

## 2016-08-01 LAB — LIPID PANEL
CHOLESTEROL: 140 mg/dL (ref 0–200)
HDL: 43 mg/dL (ref >39.00)
LDL CALC: 79 mg/dL (ref 0–99)
NONHDL: 97.37
Total CHOL/HDL Ratio: 3
Triglycerides: 93 mg/dL (ref 0.0–149.0)
VLDL: 18.6 mg/dL (ref 0.0–40.0)

## 2016-08-01 LAB — MICROALBUMIN / CREATININE URINE RATIO
CREATININE, U: 126.6 mg/dL
Microalb Creat Ratio: 0.6 mg/g (ref 0.0–30.0)
Microalb, Ur: <0.7 mg/dL (ref 0.0–1.9)

## 2016-08-01 LAB — HEMOGLOBIN A1C: Hgb A1c MFr Bld: 5.6 % (ref 4.6–6.5)

## 2016-08-01 LAB — PSA: PSA: 0.83 ng/mL (ref 0.10–4.00)

## 2016-08-08 ENCOUNTER — Encounter: Payer: Self-pay | Admitting: Family Medicine

## 2016-08-08 ENCOUNTER — Ambulatory Visit (INDEPENDENT_AMBULATORY_CARE_PROVIDER_SITE_OTHER): Payer: BLUE CROSS/BLUE SHIELD | Admitting: Family Medicine

## 2016-08-08 VITALS — BP 102/60 | HR 61 | Temp 97.9°F | Ht 67.0 in | Wt 194.4 lb

## 2016-08-08 DIAGNOSIS — H269 Unspecified cataract: Secondary | ICD-10-CM | POA: Insufficient documentation

## 2016-08-08 DIAGNOSIS — Z0000 Encounter for general adult medical examination without abnormal findings: Secondary | ICD-10-CM

## 2016-08-08 DIAGNOSIS — Z23 Encounter for immunization: Secondary | ICD-10-CM | POA: Diagnosis not present

## 2016-08-08 DIAGNOSIS — C499 Malignant neoplasm of connective and soft tissue, unspecified: Secondary | ICD-10-CM | POA: Diagnosis not present

## 2016-08-08 NOTE — Patient Instructions (Signed)
Try to establish more consistent exercise Try to lose some weight.

## 2016-08-08 NOTE — Progress Notes (Signed)
Pre visit review using our clinic review tool, if applicable. No additional management support is needed unless otherwise documented below in the visit note. 

## 2016-08-08 NOTE — Progress Notes (Signed)
Subjective:     Patient ID: Melvin Montoya, male   DOB: 01/31/1951, 65 y.o.   MRN: [id number]  HPI Patient seen for physical. He had history of leiomyosarcoma left upper chest wall couple years ago and has had close follow-up with surgery. He's had one ?lipomatous lesion removed since then. No evidence for recent recurrence. Other medical problems include history of hypertension, BPH, dyslipidemia. Medications reviewed. Compliant with all. Needs flu vaccine. Other immunizations are up-to-date. No specific risk factors for hepatitis C. Colonoscopy up-to-date.  Past Medical History:  Diagnosis Date  . Allergy    seasonal   . Anxiety    new dx  . Arthritis   . Cataract    no surgery as yet  . Chicken pox   . Chronic kidney disease    PT ON LISINOPRIL WATCHING FUNCT  . Diabetes mellitus    fasting blood glucose 110s  . Enlarged prostate   . Environmental allergies   . Leiomyosarcoma of chest wall ([place])   . Pinched nerve in neck    Past Surgical History:  Procedure Laterality Date  . APPENDECTOMY     thinks he had it taken out with his gallbladder  . CHOLECYSTECTOMY  1980  . COLONOSCOPY    . HERNIA REPAIR  [id number]   UMBILICAL   . JOINT REPLACEMENT Bilateral    knee  . KNEE ARTHROSCOPY Bilateral   . MASS EXCISION Left 01/28/2014   Procedure: EXCISION LEFT CHEST WALL  MASS;  Surgeon: Harl Bowie, MD;  Location: [facility];  Service: General;  Laterality: Left;  . TONSILLECTOMY    . TOTAL KNEE ARTHROPLASTY  08/20/2011   Procedure: TOTAL KNEE ARTHROPLASTY;  Surgeon: Kerin Salen;  Location: [facility];  Service: Orthopedics;  Laterality: Left;  . TOTAL KNEE ARTHROPLASTY Right 10//22/2014  . TOTAL KNEE ARTHROPLASTY Right 07/15/2013   Procedure: TOTAL KNEE ARTHROPLASTY;  Surgeon: Kerin Salen, MD;  Location: [facility];  Service: Orthopedics;  Laterality: Right;  [facility] VASECTOMY    . WRIST SURGERY Left 2005    reports that he has never smoked. He has never used smokeless tobacco. He reports that  he drinks alcohol. He reports that he does not use drugs. family history includes Heart disease in his father and mother. No Known Allergies   Review of Systems  Constitutional: Negative for activity change, appetite change, fatigue and fever.  HENT: Negative for congestion, ear pain and trouble swallowing.   Eyes: Negative for pain and visual disturbance.  Respiratory: Negative for cough, shortness of breath and wheezing.   Cardiovascular: Negative for chest pain and palpitations.  Gastrointestinal: Negative for abdominal distention, abdominal pain, blood in stool, constipation, diarrhea, nausea, rectal pain and vomiting.  Genitourinary: Negative for dysuria, hematuria and testicular pain.  Musculoskeletal: Negative for arthralgias and joint swelling.  Skin: Negative for rash.  Neurological: Negative for dizziness, syncope and headaches.  Hematological: Negative for adenopathy.  Psychiatric/Behavioral: Negative for confusion and dysphoric mood.       Objective:   Physical Exam  Constitutional: He is oriented to person, place, and time. He appears well-developed and well-nourished. No distress.  HENT:  Head: Normocephalic and atraumatic.  Right Ear: External ear normal.  Left Ear: External ear normal.  Mouth/Throat: Oropharynx is clear and moist.  Eyes: Conjunctivae and EOM are normal. Pupils are equal, round, and reactive to light.  Neck: Normal range of motion. Neck supple. No thyromegaly present.  Cardiovascular: Normal rate, regular rhythm and normal heart sounds.  No murmur heard. Pulmonary/Chest: No respiratory distress. He has no wheezes. He has no rales.  Patient has scar left upper chest wall from previous leiomyosarcoma excision and some indentation from loss of tissue in the region where surgery was. There is no evidence for any recurrent mass. No axillary or supraclavicular adenopathy.  Abdominal: Soft. Bowel sounds are normal. He exhibits no distension and no mass.  There is no tenderness. There is no rebound and no guarding.  Musculoskeletal: He exhibits no edema.  Lymphadenopathy:    He has no cervical adenopathy.  Neurological: He is alert and oriented to person, place, and time. He displays normal reflexes. No cranial nerve deficit.  Skin: No rash noted.  Psychiatric: He has a normal mood and affect.       Assessment:     Physical exam. Patient in need of flu vaccination    Plan:     -Labs reviewed with no major concern -Flu vaccine given -Establish more consistent exercise and try to lose some weight  Eulas Post MD [facility]

## 2016-10-03 ENCOUNTER — Other Ambulatory Visit: Payer: Self-pay

## 2016-10-03 MED ORDER — FINASTERIDE 5 MG PO TABS: 5.0000 mg | ORAL_TABLET | Freq: Every evening | ORAL | 1 refills | Status: DC

## 2016-10-15 ENCOUNTER — Other Ambulatory Visit: Payer: Self-pay

## 2016-10-15 MED ORDER — SIMVASTATIN 20 MG PO TABS: 20.0000 mg | ORAL_TABLET | Freq: Every day | ORAL | 1 refills | Status: DC

## 2016-12-26 ENCOUNTER — Other Ambulatory Visit: Payer: Self-pay | Admitting: *Deleted

## 2016-12-26 MED ORDER — TAMSULOSIN HCL 0.4 MG PO CAPS: 0.4000 mg | ORAL_CAPSULE | Freq: Every day | ORAL | 1 refills | Status: DC

## 2017-01-21 ENCOUNTER — Other Ambulatory Visit: Payer: Self-pay | Admitting: Family Medicine

## 2017-03-08 ENCOUNTER — Other Ambulatory Visit: Payer: Self-pay | Admitting: Family Medicine

## 2017-03-08 MED ORDER — GLUCOSE BLOOD VI STRP: ORAL_STRIP | 1 refills | Status: AC

## 2017-03-08 MED ORDER — LISINOPRIL 20 MG PO TABS: 20.0000 mg | ORAL_TABLET | Freq: Every day | ORAL | 3 refills | Status: AC

## 2017-03-08 MED ORDER — TAMSULOSIN HCL 0.4 MG PO CAPS: 0.4000 mg | ORAL_CAPSULE | Freq: Every day | ORAL | 1 refills | Status: AC

## 2017-03-08 MED ORDER — ONETOUCH DELICA LANCETS 33G MISC: 1 refills | Status: AC

## 2017-03-08 MED ORDER — SIMVASTATIN 20 MG PO TABS: 20.0000 mg | ORAL_TABLET | Freq: Every day | ORAL | 1 refills | Status: AC

## 2017-03-08 MED ORDER — FINASTERIDE 5 MG PO TABS: 5.0000 mg | ORAL_TABLET | Freq: Every evening | ORAL | 1 refills | Status: AC

## 2017-06-13 ENCOUNTER — Encounter: Payer: Self-pay | Admitting: Family Medicine

## 2020-08-24 DEATH — deceased

## 2021-08-24 DEATH — deceased
